# Patient Record
Sex: Female | Born: 1945 | Race: Black or African American | Hispanic: No | Smoking: Never smoker
Health system: Southern US, Community
[De-identification: ages and names within clinical notes are randomized; demographics above are authoritative.]

## PROBLEM LIST (undated history)

## (undated) DIAGNOSIS — H9319 Tinnitus, unspecified ear: Secondary | ICD-10-CM

## (undated) DIAGNOSIS — J45909 Unspecified asthma, uncomplicated: Secondary | ICD-10-CM

## (undated) DIAGNOSIS — F410 Panic disorder [episodic paroxysmal anxiety] without agoraphobia: Secondary | ICD-10-CM

## (undated) DIAGNOSIS — D649 Anemia, unspecified: Secondary | ICD-10-CM

## (undated) DIAGNOSIS — F419 Anxiety disorder, unspecified: Secondary | ICD-10-CM

---

## 2003-07-11 ENCOUNTER — Emergency Department (HOSPITAL_COMMUNITY): Admission: EM | Admit: 2003-07-11 | Discharge: 2003-07-11 | Payer: Self-pay | Admitting: Emergency Medicine

## 2003-07-16 ENCOUNTER — Emergency Department (HOSPITAL_COMMUNITY): Admission: EM | Admit: 2003-07-16 | Discharge: 2003-07-16 | Payer: Self-pay

## 2005-06-06 ENCOUNTER — Emergency Department (HOSPITAL_COMMUNITY): Admission: EM | Admit: 2005-06-06 | Discharge: 2005-06-06 | Payer: Self-pay | Admitting: Family Medicine

## 2007-12-27 ENCOUNTER — Emergency Department (HOSPITAL_COMMUNITY): Admission: EM | Admit: 2007-12-27 | Discharge: 2007-12-27 | Payer: Self-pay | Admitting: Orthopedic Surgery

## 2009-01-01 ENCOUNTER — Emergency Department (HOSPITAL_COMMUNITY): Admission: EM | Admit: 2009-01-01 | Discharge: 2009-01-01 | Payer: Self-pay | Admitting: Family Medicine

## 2009-04-05 ENCOUNTER — Emergency Department (HOSPITAL_COMMUNITY): Admission: EM | Admit: 2009-04-05 | Discharge: 2009-04-05 | Payer: Self-pay | Admitting: Emergency Medicine

## 2009-12-30 ENCOUNTER — Emergency Department (HOSPITAL_COMMUNITY): Admission: EM | Admit: 2009-12-30 | Discharge: 2009-12-30 | Payer: Self-pay | Admitting: Family Medicine

## 2010-01-01 ENCOUNTER — Emergency Department (HOSPITAL_COMMUNITY): Admission: EM | Admit: 2010-01-01 | Discharge: 2010-01-01 | Payer: Self-pay | Admitting: Emergency Medicine

## 2010-02-18 ENCOUNTER — Emergency Department (HOSPITAL_COMMUNITY): Admission: EM | Admit: 2010-02-18 | Discharge: 2010-02-18 | Payer: Self-pay | Admitting: Family Medicine

## 2010-06-13 ENCOUNTER — Emergency Department (HOSPITAL_COMMUNITY): Admission: EM | Admit: 2010-06-13 | Discharge: 2010-06-13 | Payer: Self-pay | Admitting: Family Medicine

## 2010-06-26 ENCOUNTER — Emergency Department (HOSPITAL_COMMUNITY)
Admission: EM | Admit: 2010-06-26 | Discharge: 2010-06-26 | Payer: Self-pay | Source: Home / Self Care | Admitting: Emergency Medicine

## 2010-10-19 ENCOUNTER — Emergency Department (HOSPITAL_COMMUNITY)
Admission: EM | Admit: 2010-10-19 | Discharge: 2010-10-19 | Payer: Self-pay | Source: Home / Self Care | Admitting: Emergency Medicine

## 2010-11-19 ENCOUNTER — Emergency Department (HOSPITAL_COMMUNITY)
Admission: EM | Admit: 2010-11-19 | Discharge: 2010-11-19 | Payer: Self-pay | Source: Home / Self Care | Admitting: Emergency Medicine

## 2011-01-04 LAB — POCT I-STAT, CHEM 8
BUN: 9 mg/dL (ref 6–23)
Calcium, Ion: 1.19 mmol/L (ref 1.12–1.32)
Chloride: 104 mEq/L (ref 96–112)
Sodium: 140 mEq/L (ref 135–145)
TCO2: 29 mmol/L (ref 0–100)

## 2011-01-14 LAB — POCT I-STAT, CHEM 8
BUN: 12 mg/dL (ref 6–23)
Chloride: 105 mEq/L (ref 96–112)
Glucose, Bld: 110 mg/dL — ABNORMAL HIGH (ref 70–99)
Potassium: 3.8 mEq/L (ref 3.5–5.1)
Sodium: 143 mEq/L (ref 135–145)

## 2011-02-01 LAB — POCT URINALYSIS DIP (DEVICE)
Glucose, UA: NEGATIVE mg/dL
Nitrite: NEGATIVE
Specific Gravity, Urine: 1.025 (ref 1.005–1.030)
Urobilinogen, UA: 0.2 mg/dL (ref 0.0–1.0)
pH: 6 (ref 5.0–8.0)

## 2011-07-16 LAB — POCT CARDIAC MARKERS
CKMB, poc: <1 — ABNORMAL LOW
Myoglobin, poc: 70.8
Operator id: 151321

## 2011-07-16 LAB — I-STAT 8, (EC8 V) (CONVERTED LAB)
Chloride: 108
HCT: 43
Sodium: 139
TCO2: 27
pCO2, Ven: 42.2 — ABNORMAL LOW

## 2011-07-16 LAB — POCT I-STAT CREATININE: Operator id: 151321

## 2012-05-07 ENCOUNTER — Inpatient Hospital Stay (HOSPITAL_COMMUNITY)
Admission: AD | Admit: 2012-05-07 | Discharge: 2012-05-07 | Disposition: A | Discharge status: Home / Self Care | Payer: Medicare Other | Source: Ambulatory Visit | Attending: Family Medicine | Admitting: Family Medicine

## 2012-05-07 ENCOUNTER — Encounter (HOSPITAL_COMMUNITY): Payer: Self-pay

## 2012-05-07 DIAGNOSIS — N8110 Cystocele, unspecified: Secondary | ICD-10-CM

## 2012-05-07 DIAGNOSIS — N8111 Cystocele, midline: Secondary | ICD-10-CM

## 2012-05-07 DIAGNOSIS — N898 Other specified noninflammatory disorders of vagina: Secondary | ICD-10-CM | POA: Insufficient documentation

## 2012-05-07 HISTORY — DX: Anemia, unspecified: D64.9

## 2012-05-07 NOTE — MAU Provider Note (Signed)
  History     CSN: [id number]  Arrival date and time: 05/07/12 1413   First Provider Initiated Contact with Patient 05/07/12 1508      Chief Complaint  Patient presents with  . Vaginal Bleeding   HPI [name] [name] [name] 66 y.o.  Comes to MAU today as she was squatting in the tub when bathing.  Felt a mass at the vaginal opening.  Pushed the mass back inside.  Hx of maternal uterine prolapse and is thinking this is happening to her.  OB History    Grav Para Term Preterm Abortions TAB SAB Ect Mult Living   3 3 3       3       Past Medical History  Diagnosis Date  . Anemia     History reviewed. No pertinent past surgical history.  History reviewed. No pertinent family history.  History  Substance Use Topics  . Smoking status: Never Smoker   . Smokeless tobacco: Not on file  . Alcohol Use: No    Allergies: No Known Allergies  Prescriptions prior to admission  Medication Sig Dispense Refill  . Multiple Vitamin (MULTIVITAMIN) capsule Take 1 capsule by mouth daily. Low rion      . Omega-3 Fatty Acids (OMEGA 3 PO) Take by mouth.        ROS Physical Exam   Blood pressure 123/70, pulse 97, temperature 97.6 F (36.4 C), temperature source Oral, resp. rate 18, height 5\' 7"  (1.702 m), weight 170 lb 6 oz (77.282 kg).  Physical Exam  Nursing note and vitals reviewed. Constitutional: She is oriented to person, place, and time. She appears well-developed and well-nourished.  HENT:  Head: Normocephalic.  Eyes: EOM are normal.  Neck: Neck supple.  GI: Soft. There is no tenderness.  Genitourinary:       Supine evaluation: Vulva - negative Finger exam of vagina - able to feel cervix - does not move with cough or valsalva. Minimal blood noted on glove. Soft cystocele forms at with valsalva to introitus but not beyond labia majora approx 3 cm in diameter Standing exam - no abnormality found with standing alone.  With cough, similar results to supine position.  Musculoskeletal:  Normal range of motion.  Neurological: She is alert and oriented to person, place, and time.  Skin: Skin is warm and dry.  Psychiatric: She has a normal mood and affect.    MAU Course  Procedures  MDM Consult with Dr. Shawnie Pons re: plan of care  Assessment and Plan  Bladder prolapse when squatting  Plan GYN clinic appointment Thursday, August 8 at 2:30 pm. Do not lift or do strenuous exercise requiring breath holding.  Do not squat. If prolapse occurs, push tissue inside.  Return sooner if unable to reduce any prolapse.   [name] [name],[name] 05/07/2012, 3:20 PM

## 2012-05-08 NOTE — MAU Provider Note (Signed)
Chart reviewed and agree with management and plan.  

## 2012-05-29 ENCOUNTER — Encounter: Payer: Self-pay | Admitting: Obstetrics & Gynecology

## 2012-05-29 ENCOUNTER — Ambulatory Visit (INDEPENDENT_AMBULATORY_CARE_PROVIDER_SITE_OTHER): Payer: Medicare Other | Admitting: Obstetrics & Gynecology

## 2012-05-29 VITALS — BP 121/70 | HR 80 | Temp 97.1°F | Ht 65.0 in | Wt 171.0 lb

## 2012-05-29 DIAGNOSIS — IMO0002 Reserved for concepts with insufficient information to code with codable children: Secondary | ICD-10-CM

## 2012-05-29 DIAGNOSIS — N8111 Cystocele, midline: Secondary | ICD-10-CM

## 2012-05-29 NOTE — Patient Instructions (Addendum)
Cystocele Repair A cystocele is a bulging, drooping hernia or break (rupture) of bladder tissue into the birth canal (vagina). This bulging or rupture occurs on the top front wall of the vagina. CAUSES  Cystocele is associated with weakness of the top front wall of the vagina due to stretching and tearing of the ligaments and muscles in the area. This is often the result of:  Multiple childbirths.   Continuous heavy lifting.   Chronic cough from asthma, emphysema, or smoking.   Being overweight.   Changes from aging.   Previous surgery in the vaginal area.   Menopause with loss of estrogen hormone and weakening of the ligaments and muscles around the bladder.  SYMPTOMS   Uncontrolled loss of urine (incontinence) with cough, sneeze, or exercise.   Pelvic pressure.   Frequency or urgency to urinate because of inability to completely empty the bladder.   Bladder infections.   Needing to push on the upper vagina to help yourself pass urine.  DIAGNOSIS  A cystocele can be diagnosed by doing a pelvic exam and observing the top of the vagina drooping or bulging into or out of the vagina. TREATMENT  Surgical options:  Cystocele repair is surgery that removes the hernia.   There are also different "sling" operations that may be used.  Discuss the different types of surgeries to repair a cystocele with your caregiver. Your caregiver will decide what type of surgery will be best in your case. Nonsurgical options:  Kegel exercises. This helps strengthen and tighten the muscles and tissue in and around the bladder and vagina. This may help with mild cases of cystocele.   A pessary may help the cystocele. A pessary is a plastic or rubber device that lifts the bladder into place. A pessary must be fitted by a doctor.   Tampons or diaphragms that lift the bladder into place are sometimes helpful with a minor or small cystocele.   Estrogen may help with mild cases in menopausal and aging  women.  LET YOUR CAREGIVER KNOW ABOUT:   Allergies to food or medicine.   Medicines taken, including vitamins, herbs, eyedrops, over-the-counter medicines, and creams.   Use of steroids (by mouth or creams).   Previous problems with anesthetics or numbing medicines.   History of bleeding problems or blood clots.   Previous surgery.   Other health problems, including diabetes and kidney problems.   Possibility of pregnancy, if this applies.  RISKS AND COMPLICATIONS  All surgery is associated with risks.  There are risks with a general anesthesia. You should discuss this with your caregiver.   With spinal or epidural anesthesia, there may be an area that is not numbed, and you could feel pain.   Headache could occur with a spinal or epidural anesthetic.   The catheter you will have after surgery may not work properly or may get blocked and need to be replaced.   Excessive bleeding.   Infection.   Injury to surrounding structures.   Recurrence of the cystocele.   Surgery may not get rid of your symptoms.  BEFORE THE PROCEDURE   Do not take aspirin or blood thinners for 1 week prior to surgery, unless instructed otherwise.   Do not eat or drink anything after midnight the night before surgery.   Let your caregiver know if you develop a cold or other infectious problems prior to surgery.   If being admitted the day of surgery, you should be present 1 hour prior to your   procedure or as directed by your caregiver.   Plan and arrange for help when you go home from the hospital.   If you smoke, do not smoke for at least 2 weeks before the surgery.   Do not drink any alcohol for 3 days before the surgery.  PROCEDURE  You will be given an anesthetic to prevent you from feeling pain during surgery. This may be a general anesthetic that puts you to sleep, or a spinal or epidural anesthetic. You will be asleep or be numbed through the entire procedure. During cystocele repair,  tissue is pulled from the sides and around the top of the vagina to lift up the hernia. This removes the hernia so that the top of the vagina does not fall into the opening of the vagina. AFTER THE PROCEDURE  After surgery, you will be taken to the recovery room where a nurse will take care of you, checking your breathing, blood pressure, pulse, and your progress. When your caregiver feels you are stable, you will be taken to your room. You will have a drainage tube (Foley catheter) that will drain your bladder for 2 to 7 days or longer, until your bladder is working properly. This catheter is placed prior to surgery to help keep your bladder empty and out of the way during the procedure. After surgery, this will make passing your urine easier. The catheter will be removed when you can easily pass urine without this assistance. You may have gauze packing in the vagina that will be removed 1 to 2 days after the surgery. Usually, you will be given a medicine (antibiotic) that kills germs. You will be given pain medicine as needed. You can usually go home in 3 to 5 days. HOME CARE INSTRUCTIONS   Do not take baths. Take showers until your caregiver informs you otherwise.   Take antibiotics as directed by your caregiver.   Exercise as instructed. Do not perform exercises which increase the pressure inside your belly (abdomen), such as sit-ups or lifting weights, until your caregiver has given permission. Walking exercise is preferred.   Only take over-the-counter or prescription medicines for pain and discomfort as directed by your caregiver.   Do not drink alcohol while taking pain medicine.   Do not lift anything over 5 pounds.   Do not drive until your caregiver gives you permission.   Get plenty of rest and sleep.   Have someone help with your household chores for 1 to 2 weeks.   If you develop constipation, you may take a mild laxative with your caregiver's permission. Eating bran foods and  drinking enough water and fluids to keep your urine clear or pale yellow helps with constipation.   Do not take aspirin. It may cause bleeding.   You may resume normal diet and unstrenuous activities as directed.   Do not douche, use tampons, or engage in intercourse until your surgeon has given permission.   Change bandages (dressings) as directed.   Make and keep all your postoperative appointments.  SEEK MEDICAL CARE IF:   You have abnormal vaginal discharge.   You develop a rash.   You are having a reaction to your medicine.   You develop nausea or vomiting.  SEEK IMMEDIATE MEDICAL CARE IF:   You have redness, swelling, or increasing pain in the vaginal area.   You notice pus coming from the vagina.   You have a fever.   You notice a bad smell coming from the vagina.     You have increasing abdominal pain.   You have frequent urination or you notice burning during urination.   You notice blood in your urine.   You have excessive vaginal bleeding.   You cannot urinate.  MAKE SURE YOU:   Understand these instructions.   Will watch your condition.   Will get help right away if you are not doing well or get worse.  Document Released: 10/05/2000 Document Revised: 09/27/2011 Document Reviewed: 01/05/2010 [employer] Patient Information 2012 [employer].Prolapse  Prolapse means the falling down, bulging, dropping, or drooping of a body part. Organs that commonly prolapse include the rectum, small intestine, bladder, urethra, vagina (birth canal), uterus (womb), and cervix. Prolapse occurs when the ligaments and muscle tissue around the rectum, bladder, and uterus are damaged or weakened.  CAUSES  This happens especially with:  Childbirth. Some women feel pelvic pressure or have trouble holding their urine right after childbirth, because of stretching and tearing of pelvic tissues. This generally gets better with time and the feeling usually goes away, but it may return  with aging.   Chronic heavy lifting.   Aging.   Menopause, with loss of estrogen production weakening the pelvic ligaments and muscles.   Past pelvic surgery.   Obesity.   Chronic constipation.   Chronic cough.  Prolapse may affect a single organ, or several organs may prolapse at the same time. The front wall of the vagina holds up the bladder. The back wall holds up part of the lower intestine, or rectum. The uterus fills a spot in the middle. All these organs can be involved when the ligaments and muscles around the vagina relax too much. This often gets worse when women stop producing estrogen (menopause). SYMPTOMS  Uncontrolled loss of urine (incontinence) with cough, sneeze, straining, and exercise.   More force may be required to have a bowel movement, due to trapping of the stool.   When part of an organ bulges through the opening of the vagina, there is sometimes a feeling of heaviness or pressure. It may feel as though something is falling out. This sensation increases with coughing or bearing down.   If the organs protrude through the opening of the vagina and rub against the clothing, there may be soreness, ulcers, infection, pain, and bleeding.   Lower back pain.   Pushing in the upper or lower part of the vagina, to pass urine or have a bowel movement.   Problems having sexual intercourse.   Being unable to insert a tampon or applicator.  DIAGNOSIS  Usually, a physical exam is all that is needed to identify the problem. During the examination, you may be asked to cough and strain while lying down, sitting up, and standing up. Your caregiver will determine if more testing is required, such as bladder function tests. Some diagnoses are:  Cystocele: Bulging and falling of the bladder into the top of the vagina.   Rectocele: Part of the rectum bulging into the vagina.   Prolapse of the uterus: The uterus falls or drops into the vagina.   Enterocele: Bulging of the  top of the vagina, after a hysterectomy (uterus removal), with the small intestine bulging into the vagina. A hernia in the top of the vagina.   Urethrocele: The urethra (urine carrying tube) bulging into the vagina.  TREATMENT  In most cases, prolapse needs to be treated only if it produces symptoms. If the symptoms are interfering with your usual daily or sexual activities, treatment may be necessary.   The following are some measures that may be used to treat prolapse.  Estrogen may help elderly women with mild prolapse.   Kegel exercises may help mild cases of prolapse, by strengthening and tightening the muscles of the pelvic floor.   Pessaries are used in women who choose not to, or are unable to, have surgery. A pessary is a doughnut-shaped piece of plastic or rubber that is put into the vagina to keep the organs in place. This device must be fitted by your caregiver. Your caregiver will also explain how to care for yourself with the pessary. If it works well for you, this may be the only treatment required.   Surgery is often the only form of treatment for more severe prolapses. There are different types of surgery available. You should discuss what the best procedure is for you. If the uterus is prolapsed, it may be removed (hysterectomy) as part of the surgical treatment. Your caregiver will discuss the risks and benefits with you.   Uterine-vaginal suspension (surgery to hold up the organs) may be used, especially if you want to maintain your fertility.  No form of treatment is guaranteed to correct the prolapse or relieve the symptoms. HOME CARE INSTRUCTIONS   Wear a sanitary pad or absorbent product if you have incontinence of urine.   Avoid heavy lifting and straining with exercise and work.   Take over-the-counter pain medicine for minor discomfort.   Try taking estrogen or using estrogen vaginal cream.   Try Kegel exercises or use a pessary, before deciding to have surgery.     Do Kegel exercises after having a baby.  SEEK MEDICAL CARE IF:   Your symptoms interfere with your daily activities.   You need medicine to help with the discomfort.   You need to be fitted with a pessary.   You notice bleeding from the vagina.   You think you have ulcers or you notice ulcers on the cervix.   You have an oral temperature above 102 F (38.9 C).   You develop pain or blood with urination.   You have bleeding with a bowel movement.   The symptoms are interfering with your sex life.   You have urinary incontinence that interferes with your daily activities.   You lose urine with sexual intercourse.   You have a chronic cough.   You have chronic constipation.  Document Released: 04/14/2003 Document Revised: 09/27/2011 Document Reviewed: 10/23/2009 [employer] Patient Information 2012 [employer]. 

## 2012-05-29 NOTE — Progress Notes (Signed)
Pt is very focused on being natural

## 2012-05-29 NOTE — Progress Notes (Signed)
Subjective:     Patient ID: [name] [name], female   DOB: 1945-12-21, 66 y.o.   MRN: [id number]  HPI  Pt is a P3 who is s/p SVD x 3 who was seen in the MAU 3 weeks previously 2nd to bulge in vagina.  Pt reports that she subsequently discovered that she was constipated and thinks that that contributed to the problem.  Pt denies vaginal bleeding or leakage of urine.  Has not had a GYN exam for 'awhile'.  Pt denies being sexually active.     Review of Systems N/C     Objective:   Physical Exam   VSS:  Afebrile  Abd: soft, NT, ND GU: EGBUS: no lesions Vagina: no blood in vault.  Grade II cystocele Cervix: no lesion; no mucopurulent d/c Uterus: small, mobile.  No prolapse noted even with valsalva Adnexa: no masses; sl tender      Assessment:     Grade II cystocele.  Reviewed with pt options for treatment including surgery and pessary and conservation nontreatment.  Pt does not want medical or surgical treatment     Plan:     F/u 3months PAP at next visit Reviewed with pt phytoestrogens which she will consider Kegel exercises  Jolleen Seman L. Harraway-Smith, M.D., [place]

## 2012-07-08 ENCOUNTER — Telehealth: Payer: Self-pay | Admitting: *Deleted

## 2012-07-08 NOTE — Telephone Encounter (Signed)
LM for patient to return call to clinic.

## 2012-07-08 NOTE — Telephone Encounter (Signed)
Pt left message stating that she was seen in clinic 1 month ago for prolapsed bladder. She is taking estrogen. She reports that she is still having pressure and ringing in her ears. She wants to know what to do.

## 2012-07-09 NOTE — Telephone Encounter (Signed)
Called pt and I advised pt that she should make a follow up appt with a provider for further evaluation.  Pt stated understanding and had no further questions.  Transferred [name] [name] to the front desk to schedule an appt.

## 2012-07-16 ENCOUNTER — Encounter (HOSPITAL_COMMUNITY): Payer: Self-pay

## 2012-07-16 ENCOUNTER — Emergency Department (HOSPITAL_COMMUNITY)
Admission: EM | Admit: 2012-07-16 | Discharge: 2012-07-16 | Disposition: A | Discharge status: Home / Self Care | Payer: Medicare Other | Attending: Emergency Medicine | Admitting: Emergency Medicine

## 2012-07-16 DIAGNOSIS — F410 Panic disorder [episodic paroxysmal anxiety] without agoraphobia: Secondary | ICD-10-CM | POA: Insufficient documentation

## 2012-07-16 DIAGNOSIS — F411 Generalized anxiety disorder: Secondary | ICD-10-CM | POA: Insufficient documentation

## 2012-07-16 HISTORY — DX: Anxiety disorder, unspecified: F41.9

## 2012-07-16 NOTE — ED Notes (Signed)
Pt states ringing in her ears caused her to have a anxiety attack, states feels better now

## 2012-07-16 NOTE — ED Provider Notes (Signed)
History     CSN: [id number]  Arrival date & time 07/16/12  2132   First MD Initiated Contact with Patient 07/16/12 2207      Chief Complaint  Patient presents with  . Anxiety    (Consider location/radiation/quality/duration/timing/severity/associated sxs/prior treatment) HPI Comments: Patient presents after having an anxiety attack that was triggered by patient experiencing ringing in her ears intermittently for the past several weeks. The tinnitus symptoms are very concerning to her. Patient describes the sensation of palpitations and chest pressure with the anxiety. Patient went to a nearby fire department and an ambulance was called to transport the patient to hospital. Patient states all of her symptoms are resolved upon arrival to ED. Her chest pain was described as a pressure, was nonradiating, was over the middle of her chest. She states that she has had this with previous anxiety attacks. Patient has no shortness of breath. She denies nausea or vomiting. The pain resolved after approximately 45 minutes. Patient denies history of cardiac problems, high blood pressure, high cholesterol, smoking, family history of coronary artery disease. Onset was acute. Course is resolved. Nothing makes symptoms better or worse.  The history is provided by the patient.    Past Medical History  Diagnosis Date  . Anemia   . Anxiety     History reviewed. No pertinent past surgical history.  No family history on file.  History  Substance Use Topics  . Smoking status: Never Smoker   . Smokeless tobacco: Not on file  . Alcohol Use: No    OB History    Grav Para Term Preterm Abortions TAB SAB Ect Mult Living   3 3 3       3       Review of Systems  Constitutional: Negative for fever.  HENT: Positive for tinnitus. Negative for hearing loss, ear pain, sore throat and rhinorrhea.   Eyes: Negative for redness.  Respiratory: Negative for cough and shortness of breath.   Cardiovascular:  Positive for chest pain and palpitations. Negative for leg swelling.  Gastrointestinal: Negative for nausea, vomiting, abdominal pain and diarrhea.  Genitourinary: Negative for dysuria.  Musculoskeletal: Negative for myalgias.  Skin: Negative for rash.  Neurological: Negative for headaches.  Psychiatric/Behavioral: The patient is nervous/anxious.     Allergies  Review of patient's allergies indicates no known allergies.  Home Medications   Current Outpatient Rx  Name Route Sig Dispense Refill  . MULTIVITAMINS PO CAPS Oral Take 1 capsule by mouth daily. Low rion    . OMEGA 3 PO Oral Take 1 tablet by mouth daily.     Marland Kitchen PRESCRIPTION MEDICATION Oral Take 1 capsule by mouth 4 (four) times daily.    Marland Kitchen VITAMIN B-12 100 MCG PO TABS Oral Take 50 mcg by mouth daily.      BP 136/75  Pulse 98  Temp 98.6 F (37 C) (Oral)  Resp 20  SpO2 100%  Physical Exam  Nursing note and vitals reviewed. Constitutional: She appears well-developed and well-nourished.  HENT:  Head: Normocephalic and atraumatic.  Eyes: Conjunctivae normal are normal. Right eye exhibits no discharge. Left eye exhibits no discharge.  Neck: Normal range of motion. Neck supple.  Cardiovascular: Normal rate, regular rhythm and normal heart sounds.   Pulmonary/Chest: Effort normal and breath sounds normal.  Abdominal: Soft. There is no tenderness.  Neurological: She is alert.  Skin: Skin is warm and dry.  Psychiatric: She has a normal mood and affect.    ED Course  Procedures (including  critical care time)  Labs Reviewed - No data to display No results found.   1. Panic attack     11:37 PM Patient seen and examined. EKG ordered. Patient's anxiety symptoms have resolved.   Vital signs reviewed and are as follows: Filed Vitals:   07/16/12 2142  BP: 136/75  Pulse: 98  Temp: 98.6 F (37 C)  Resp: 20    Date: 07/17/2012  Rate: 73  Rhythm: normal sinus rhythm  QRS Axis: normal  Intervals: normal  ST/T Wave  abnormalities: nonspecific ST changes  Conduction Disutrbances:none  Narrative Interpretation: PR depression  Old EKG Reviewed: unchanged from 06/26/2010  Patient was discussed and seen by Dr. Adriana Simas. The patient's chest pain symptoms corresponded exactly with her anxiety symptoms. EKG performed is abnormal but unchanged from EKG performed 2 years ago.  Patient urged to followup with her primary care physician for further evaluation of her symptoms including her tinnitus.   MDM  Chest pain: Patient is low risk, however she is 66 years old. Patient's symptoms corresponded exactly with her anxiety symptoms. She has had this exact type of chest pain in the past with her anxiety. EKG is abnormal however it is unchanged. Patient has another clinical reason for her chest pain. Do not feel this represents GCS at this time. Given unchanged EKG, do not feel that further workup is necessary at this time. Patient given strict return instructions and will followup with her primary care physician.  Anxiety attack resolved. No SI/HI.         Renne Crigler, [place] 07/17/12 0140

## 2012-07-16 NOTE — ED Notes (Signed)
Per EMS, pt c/o panicking at home.  When EMS, elevated hr and b/p.  Pt has hx panic attacks.  Pt recently started having ringing in ear a week ago.  Causing stress.  Pt has seen MD for sx.  Pt not able to answer many questions to EMS. Vitals  180/86, pulse 100, resp 18, 99% on ra

## 2012-07-18 ENCOUNTER — Emergency Department (HOSPITAL_COMMUNITY): Payer: Medicare Other

## 2012-07-18 ENCOUNTER — Encounter (HOSPITAL_COMMUNITY): Payer: Self-pay | Admitting: *Deleted

## 2012-07-18 ENCOUNTER — Emergency Department (HOSPITAL_COMMUNITY)
Admission: EM | Admit: 2012-07-18 | Discharge: 2012-07-18 | Disposition: A | Discharge status: Home / Self Care | Payer: Medicare Other | Attending: Emergency Medicine | Admitting: Emergency Medicine

## 2012-07-18 DIAGNOSIS — R55 Syncope and collapse: Secondary | ICD-10-CM | POA: Insufficient documentation

## 2012-07-18 DIAGNOSIS — R110 Nausea: Secondary | ICD-10-CM | POA: Insufficient documentation

## 2012-07-18 HISTORY — DX: Panic disorder (episodic paroxysmal anxiety): F41.0

## 2012-07-18 HISTORY — DX: Tinnitus, unspecified ear: H93.19

## 2012-07-18 LAB — URINALYSIS, ROUTINE W REFLEX MICROSCOPIC
Ketones, ur: 15 mg/dL — AB
Specific Gravity, Urine: 1.009 (ref 1.005–1.030)

## 2012-07-18 LAB — POCT I-STAT, CHEM 8
BUN: 14 mg/dL (ref 6–23)
Calcium, Ion: 1.26 mmol/L (ref 1.13–1.30)
Chloride: 103 mEq/L (ref 96–112)
Creatinine, Ser: 1 mg/dL (ref 0.50–1.10)
Glucose, Bld: 90 mg/dL (ref 70–99)
HCT: 38 % (ref 36.0–46.0)
Hemoglobin: 12.9 g/dL (ref 12.0–15.0)
Potassium: 4 meq/L (ref 3.5–5.1)
Sodium: 139 mEq/L (ref 135–145)
TCO2: 28 mmol/L (ref 0–100)

## 2012-07-18 LAB — CBC
HCT: 36.7 % (ref 36.0–46.0)
Hemoglobin: 12.1 g/dL (ref 12.0–15.0)
MCH: 28.1 pg (ref 26.0–34.0)
MCHC: 33 g/dL (ref 30.0–36.0)
MCV: 85.3 fL (ref 78.0–100.0)
Platelets: 285 10*3/uL (ref 150–400)
RBC: 4.3 MIL/uL (ref 3.87–5.11)
RDW: 14.5 % (ref 11.5–15.5)
WBC: 5.8 K/uL (ref 4.0–10.5)

## 2012-07-18 LAB — URINE MICROSCOPIC-ADD ON

## 2012-07-18 NOTE — ED Notes (Signed)
Feel like going to pass out. Started this am. When home bp 180/90, now sbp 120-130/90.  At [facility] on 07/16/12 for anxiety problem.

## 2012-07-18 NOTE — ED Provider Notes (Signed)
Medical screening examination/treatment/procedure(s) were conducted as a shared visit with non-physician practitioner(s) and myself.  I personally evaluated the patient during the encounter.  History and physical most consistent with anxiety today. Patient has absolutely no chest pain at this time.  Donnetta Hutching, MD 07/18/12 [phone]

## 2012-07-18 NOTE — ED Notes (Signed)
Chem 8 results=  Na=139 K=4.0 Cl=103 Ica1.26 Tco2=28 Glu=90 bun14 Crea=1.0 Hct=38 Hb*=12.9 Angap=12

## 2012-07-18 NOTE — ED Notes (Signed)
Pt states "Im back here because I felt like I was going to pass out at home and I just wanted to be able to take my own car but I started getting sicker and sicker." "I'm trying to figure out why when I turn on my television at home I get deathly ill if I watch it for 30 minutes." Pt states she believes electronic frequency waves are bothering her.Pt states "if I'm listening to the radio this happens as well."

## 2012-07-18 NOTE — ED Provider Notes (Signed)
History     CSN: [id number]  Arrival date & time 07/18/12  1017   First MD Initiated Contact with Patient 07/18/12 1419      Chief Complaint  Patient presents with  . Near Syncope  . Blurred Vision  . Nausea     HPI   The patient presents with multiple complaints.  Concerning to the patient is the recent development of episodic near syncope, tinnitus, pulse incision in her years.  She states that the symptoms are provoked when she is watching TV or listening to the radio.  Her symptoms improved after unplugging a television, or when listening to the radio running on batteries.  She states that symptoms began gradually, progressi until she stops the offending activity, and today was a particularly bad episode.  She notes that after she was feeling near syncopal, she drove here for evaluation.  Symptoms resolved in the waiting room, as she was not watching TV, or listening to a radio. She has been evaluated for anxiety in the past month.  She states that since that evaluation she has had episodes of chest pain, but seemed to occur when her near syncopal symptoms are most pronounced. The patient denies significant medical problems. She states that she is a family history of neurologic disorders, with both of her parents dying from neurologic phenomena.  Past Medical History  Diagnosis Date  . Anemia   . Anxiety   . Panic attacks   . Ringing in ear     History reviewed. No pertinent past surgical history.  No family history on file.  History  Substance Use Topics  . Smoking status: Never Smoker   . Smokeless tobacco: Not on file  . Alcohol Use: No    OB History    Grav Para Term Preterm Abortions TAB SAB Ect Mult Living   3 3 3       3       Review of Systems  Constitutional:       HPI  HENT:       HPI otherwise negative  Eyes: Negative.   Respiratory:       HPI, otherwise negative  Cardiovascular:       HPI, otherwise nmegative  Gastrointestinal: Negative for  nausea, vomiting and abdominal pain.  Genitourinary:       HPI, otherwise negative  Musculoskeletal:       HPI, otherwise negative  Skin: Negative.   Neurological: Positive for light-headedness. Negative for dizziness, tremors, seizures, syncope, facial asymmetry, speech difficulty, numbness and headaches.  Psychiatric/Behavioral: Negative for suicidal ideas, hallucinations, behavioral problems, confusion, disturbed wake/sleep cycle, self-injury and agitation. The patient is nervous/anxious. The patient is not hyperactive.     Allergies  Review of patient's allergies indicates no known allergies.  Home Medications   Current Outpatient Rx  Name Route Sig Dispense Refill  . MULTIVITAMINS PO CAPS Oral Take 1 capsule by mouth daily. Low rion    . OMEGA 3 PO Oral Take 1 tablet by mouth daily.     Marland Kitchen PRESCRIPTION MEDICATION Oral Take 1 capsule by mouth 4 (four) times daily.      BP 133/67  Pulse 101  Temp 98.3 F (36.8 C) (Oral)  Resp 18  SpO2 99%  Physical Exam  Nursing note and vitals reviewed. Constitutional: She is oriented to person, place, and time. She appears well-developed and well-nourished. No distress.  HENT:  Head: Normocephalic and atraumatic.  Eyes: Conjunctivae normal and EOM are normal.  Cardiovascular: Normal rate  and regular rhythm.   Pulmonary/Chest: Effort normal and breath sounds normal. No stridor. No respiratory distress.  Abdominal: She exhibits no distension.  Musculoskeletal: She exhibits no edema.  Neurological: She is alert and oriented to person, place, and time. No cranial nerve deficit.  Skin: Skin is warm and dry.  Psychiatric: She has a normal mood and affect. Her speech is normal and behavior is normal. Judgment normal. Thought content is delusional. Thought content is not paranoid. Cognition and memory are impaired. She exhibits normal recent memory and normal remote memory.    ED Course  Procedures (including critical care time)   Labs  Reviewed  CBC  POCT I-STAT, CHEM 8  URINALYSIS, ROUTINE W REFLEX MICROSCOPIC   No results found.   No diagnosis found.   Date: 07/18/2012  Rate: 70  Rhythm: normal sinus rhythm  QRS Axis: normal  Intervals: normal  ST/T Wave abnormalities: normal  Conduction Disutrbances:none  Narrative Interpretation:   Old EKG Reviewed: unchanged LVH, otherwise unremarkable / unchanged   MDM  This pleasant elderly female presents with concerns of recurrent near syncopal events associated with tinnitus, ear fullness.  On my exam the patient is in no distress.  She also denies any ongoing symptoms.  Given the patient's history of similar prior events, or history of anxiety, her description of very unusual precipitant, there is low suspicion for acute ongoing pathology.  I discussed the need for outpatient evaluation including a discussion on the patient's believe that the symptoms are provoked by external stimuli.  The patient denies any psychiatric history, any auditory or visual hallucinations, and though this seems to be evidence, she does have capacity, otherwise has reasonable insight into her condition, and is appropriate for ongoing management as an outpatient.    Gerhard Munch, MD 07/18/12 1705

## 2012-07-18 NOTE — ED Notes (Signed)
Patient stated going outside to make a phone call. Will be right back.

## 2012-07-18 NOTE — ED Notes (Signed)
Patient transported to CT 

## 2012-07-20 ENCOUNTER — Emergency Department (INDEPENDENT_AMBULATORY_CARE_PROVIDER_SITE_OTHER)
Admission: EM | Admit: 2012-07-20 | Discharge: 2012-07-20 | Disposition: A | Discharge status: Nursing Home (Includes ICF) | Payer: Medicare Other | Source: Home / Self Care

## 2012-07-20 ENCOUNTER — Encounter (HOSPITAL_COMMUNITY): Payer: Self-pay | Admitting: Emergency Medicine

## 2012-07-20 ENCOUNTER — Encounter (HOSPITAL_COMMUNITY): Payer: Self-pay | Admitting: *Deleted

## 2012-07-20 ENCOUNTER — Emergency Department (HOSPITAL_COMMUNITY)
Admission: EM | Admit: 2012-07-20 | Discharge: 2012-07-20 | Disposition: A | Discharge status: Home / Self Care | Payer: Medicare Other | Attending: Emergency Medicine | Admitting: Emergency Medicine

## 2012-07-20 DIAGNOSIS — F22 Delusional disorders: Secondary | ICD-10-CM | POA: Insufficient documentation

## 2012-07-20 DIAGNOSIS — R440 Auditory hallucinations: Secondary | ICD-10-CM

## 2012-07-20 DIAGNOSIS — R443 Hallucinations, unspecified: Secondary | ICD-10-CM

## 2012-07-20 DIAGNOSIS — F411 Generalized anxiety disorder: Secondary | ICD-10-CM | POA: Insufficient documentation

## 2012-07-20 MED ORDER — ONDANSETRON HCL 8 MG PO TABS: 4.0000 mg | ORAL_TABLET | Freq: Three times a day (TID) | ORAL | Status: DC | PRN

## 2012-07-20 MED ORDER — LORAZEPAM 1 MG PO TABS: 1.0000 mg | ORAL_TABLET | Freq: Three times a day (TID) | ORAL | Status: DC | PRN

## 2012-07-20 MED ORDER — IBUPROFEN 200 MG PO TABS: 600.0000 mg | ORAL_TABLET | Freq: Three times a day (TID) | ORAL | Status: DC | PRN

## 2012-07-20 MED ORDER — ALUM & MAG HYDROXIDE-SIMETH 200-200-20 MG/5ML PO SUSP: 30.0000 mL | ORAL | Status: DC | PRN

## 2012-07-20 MED ORDER — ACETAMINOPHEN 325 MG PO TABS: 650.0000 mg | ORAL_TABLET | ORAL | Status: DC | PRN

## 2012-07-20 MED ORDER — NICOTINE 21 MG/24HR TD PT24: 21.0000 mg | MEDICATED_PATCH | Freq: Every day | TRANSDERMAL | Status: DC

## 2012-07-20 MED ORDER — ZOLPIDEM TARTRATE 5 MG PO TABS: 10.0000 mg | ORAL_TABLET | Freq: Every evening | ORAL | Status: DC | PRN

## 2012-07-20 NOTE — ED Notes (Signed)
Provider requesting to speak with someone from [facility].  I spoke with Tawanna Cooler from [facility].  He stated patient could need to be transferred to ED for evaluation.  He stated he would page [facility] team member to call us and he took patient's name and MRN #

## 2012-07-20 NOTE — ED Notes (Addendum)
Pt. Stated, I've got somebody stalking me for 10 years.  I've called the FBI, SBI, and all the federal people.  They are taking over all my computer.   Pt. [facility] and was sent here for psych evaluation. Denies SI/HI at the time.

## 2012-07-20 NOTE — ED Provider Notes (Addendum)
History   This chart was scribed for [name] Givens, MD by Melba Coon. The patient was seen in room TR06C/TR06C and the patient's care was started at 3:40PM.    CSN: [id number]  Arrival date & time 07/20/12  1336   First MD Initiated Contact with Patient 07/20/12 1517      Chief Complaint  Patient presents with  . Hallucinations    (Consider location/radiation/quality/duration/timing/severity/associated sxs/prior treatment) The history is provided by the patient. No language interpreter was used.   [name] [name] is a 66 y.o. female who presents to the Emergency Department "against her own free will" escorted by the police department for hallucinations today. [name] [name] reports her phone is being tapped. This isn't her first time reporting her phone being tapped and has multiple reports with the SBI and FBI for many years. She states that when she went to the police dept to report it today, she "was tricked" to come to the ED today. She also reports tinnitus since 1.5 months ago that was worsened when she listened to the TV or the radio the past few weeks. She called the police at the first incident and was taken to [facility]. She was checked and everything was fine. She still reports having tinnitus but only when she listens to the TV or radio at her house. She "unplugged everything from the wall" in her bedroom, took any eectronic devices out of her bedroom, and has not had a problem with tinnitus since. She also hears beeps when she has phone calls, including today, saying that she is being recorded. She feels like she is being stalked in her own house. Neighbors have not had similar problems with suspicions of being stalked. She has never talked to a psychiatrist or been to a psychiatric hospital. She states she is physically fine and does not complain of any pain or symptoms. 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 She relates she's related to "everything who is famous" and specifically states she's related to all the presidents including Mr Phillips [place], [place], [place], and [place]. Patient reports she tried to go to a safe place today because she felt like someone was talking her in her apartment and she was hearing beeping in her bedroom. She states she went to the fire department and tried to report the wire tapping incident however they state she can report the wire tapping incident until she was certified to not be crazy. Patient also concerned she has a new Medicaid card and has a mental health name and number on it. Patient also states she's having problems on the Internet and she gets messages that state "you are not allowed access to the site".  .PCP: Dr. Everlene Other; has an appt for Oct 3rd  Past Medical History  Diagnosis Date  . Anemia   . Anxiety   . Panic attacks   . Ringing in ear     History reviewed. No pertinent past surgical history.  No family history on file.  History  Substance Use Topics  . Smoking status: Never Smoker   . Smokeless tobacco: Not on file  . Alcohol Use: No  No recent deaths in the family, not married. Used to be a Pharmacologist retired  OB History    Grav Para Term Preterm Abortions TAB SAB Ect Mult Living   3 3 3       3       Review of Systems 10 Systems reviewed and all are negative for acute change except as noted in the HPI.   Allergies  Review of patient's allergies indicates no known allergies.  Home Medications   Current Outpatient Rx  Name Route Sig Dispense Refill  . MULTIVITAMINS PO CAPS Oral Take 1 capsule by mouth daily. Low rion    . OMEGA 3 PO Oral Take 1 tablet by mouth daily.     Marland Kitchen PRESCRIPTION MEDICATION Oral Take 1 capsule by mouth 4 (four) times daily.      BP  132/76  Pulse 109  Temp 98.3 F (36.8 C) (Oral)  Resp 16  SpO2 98%  Vital signs normal    Physical Exam  Nursing note and vitals reviewed. Constitutional: She is oriented to person, place, and time. She appears well-developed and well-nourished. No distress.  HENT:  Head: Normocephalic and atraumatic.  Right Ear: External ear normal.  Left Ear: External ear normal.  Nose: Nose normal.  Mouth/Throat: Oropharynx is clear and moist.  Eyes: Conjunctivae normal and EOM are normal. Pupils are equal, round, and reactive to light.  Neck: Normal range of motion. Neck supple. No tracheal deviation present.  Cardiovascular: Normal rate, regular rhythm and normal heart sounds.   No murmur heard. Pulmonary/Chest: Effort normal and breath sounds normal. No respiratory distress. She has no wheezes.  Abdominal: Soft. She exhibits no distension. There is no tenderness.  Musculoskeletal: Normal range of motion. She exhibits no edema and no tenderness.  Neurological: She is alert and oriented to person, place, and time.  Skin: Skin is warm and dry.  Psychiatric:       Pt seems paranoid, gets agitated easily.    ED Course  Procedures (including critical care time)  DIAGNOSTIC STUDIES: Oxygen Saturation is 100% on room air, normal by my interpretation.    COORDINATION OF CARE:  3:50PM - [name] Dede refuses blood w/u because she had it done 2 days ago here at [facility]  [name] Greenhouse left her room and then returned. Accused me of "tricking" her to talk to me.  Getting agitated and wanting to leave, states I can't hold her "against her will".   Commitment papers were signed and filled out to keep patient in the ED to finish her psychiatric evaluation.  5:00PM - Police officers present report that she has longstanding hx of mental issues and is in denial of her mental issues. Commitment papers were filed for [name] [name].  20:03 Dr Jacky Kindle, telepsych discussed patient will do consult now.   2018  Dr Jacky Kindle relates patient has a delusional disorder, but she refuses to take seroquel, she only wants homeopathic medications. Feels her IVC can be rescended and she can be discharged.   Results for orders placed during the hospital encounter of 07/18/12  CBC      Component Value Range   WBC 5.8  4.0 - 10.5 K/uL   RBC 4.30  3.87 - 5.11 MIL/uL   Hemoglobin 12.1  12.0 - 15.0 g/dL   HCT 47.8  29.5 - 62.1 %   MCV 85.3  78.0 - 100.0 fL   MCH 28.1  26.0 - 34.0 pg   MCHC 33.0  30.0 - 36.0 g/dL   RDW 30.8  65.7 - 84.6 %   Platelets 285  150 -  400 K/uL  POCT I-STAT, CHEM 8      Component Value Range   Sodium 139  135 - 145 mEq/L   Potassium 4.0  3.5 - 5.1 mEq/L   Chloride 103  96 - 112 mEq/L   BUN 14  6 - 23 mg/dL   Creatinine, Ser 1.19  0.50 - 1.10 mg/dL   Glucose, Bld 90  70 - 99 mg/dL   Calcium, Ion 1.47  8.29 - 1.30 mmol/L   TCO2 28  0 - 100 mmol/L   Hemoglobin 12.9  12.0 - 15.0 g/dL   HCT 56.2  13.0 - 86.5 %  URINALYSIS, ROUTINE W REFLEX MICROSCOPIC      Component Value Range   Color, Urine YELLOW  YELLOW   APPearance CLEAR  CLEAR   Specific Gravity, Urine 1.009  1.005 - 1.030   pH 7.5  5.0 - 8.0   Glucose, UA NEGATIVE  NEGATIVE mg/dL   Hgb urine dipstick NEGATIVE  NEGATIVE   Bilirubin Urine NEGATIVE  NEGATIVE   Ketones, ur 15 (*) NEGATIVE mg/dL   Protein, ur NEGATIVE  NEGATIVE mg/dL   Urobilinogen, UA 1.0  0.0 - 1.0 mg/dL   Nitrite NEGATIVE  NEGATIVE   Leukocytes, UA SMALL (*) NEGATIVE  URINE MICROSCOPIC-ADD ON      Component Value Range   Squamous Epithelial / LPF MANY (*) RARE   WBC, UA 0-2  <3 WBC/hpf   RBC / HPF 0-2  <3 RBC/hpf   Bacteria, UA RARE  RARE   Laboratory interpretation all normal except contaminated urine    1. Delusional disorder     Plan psychiatric evaluation  Plan discharge   Devoria Albe, MD, FACEP   MDM   I personally performed the services described in this documentation, which was scribed in my presence. The recorded information has  been reviewed and considered.  Devoria Albe, MD, FACEP        [name] Givens, MD 07/20/12 1807  [name] Givens, MD 07/20/12 2004  [name] Givens, MD 07/20/12 2023

## 2012-07-20 NOTE — ED Provider Notes (Signed)
Medical screening examination/treatment/procedure(s) were performed by resident physician or non-physician practitioner and as supervising physician I was immediately available for consultation/collaboration.   Barkley Bruns MD.    Linna Hoff, MD 07/20/12 [id number]

## 2012-07-20 NOTE — ED Provider Notes (Signed)
History     CSN: [id number]  Arrival date & time 07/20/12  1114   None     Chief Complaint  Patient presents with  . Hallucinations    (Consider location/radiation/quality/duration/timing/severity/associated sxs/prior treatment) HPI Comments: This 66 year old female presents to the urgent care by older of the police department. She states the police told her to come here to get her blood pressure checked more specifically she has been experiencing strange occurrences in her apartment. These are related to electrical appliances in particular the TV and the radio. The upper abdomen to a see electricity these are throwing waves to her ears and causing her ears to range.Devices and turned off the ringing gradually abates. She's tried removing in no from the a.c. connection and placing batteries which places the batteries in the radio the ringing stops. She is not sleeping well she felt exhausted has not had any rest. Although, she had a good night sleep last night because she was wearing earplugs. She states that she has been to the police she is called home when security, the FBI, the SBI, and they have not been able to help her. She states she is in danger from right catheters Cipro bullying Cipro aspirin and unusual messages at all her computer when she wakes in the morning.   Past Medical History  Diagnosis Date  . Anemia   . Anxiety   . Panic attacks   . Ringing in ear     History reviewed. No pertinent past surgical history.  Family History  Problem Relation Age of Onset  . Family history unknown: Yes    History  Substance Use Topics  . Smoking status: Never Smoker   . Smokeless tobacco: Not on file  . Alcohol Use: No    OB History    Grav Para Term Preterm Abortions TAB SAB Ect Mult Living   3 3 3       3       Review of Systems  Constitutional: Negative.   HENT: Negative.   Respiratory: Negative.   Cardiovascular: Positive for palpitations.  Musculoskeletal:  Negative.   Psychiatric/Behavioral: Positive for hallucinations and disturbed wake/sleep cycle. The patient is nervous/anxious.        CHPI    Allergies  Review of patient's allergies indicates no known allergies.  Home Medications   Current Outpatient Rx  Name Route Sig Dispense Refill  . MULTIVITAMINS PO CAPS Oral Take 1 capsule by mouth daily. Low rion    . OMEGA 3 PO Oral Take 1 tablet by mouth daily.     Marland Kitchen PRESCRIPTION MEDICATION Oral Take 1 capsule by mouth 4 (four) times daily.      BP 118/53  Pulse 95  Temp 98.3 F (36.8 C) (Oral)  Resp 18  SpO2 100%  Physical Exam  Constitutional: She is oriented to person, place, and time. She appears well-developed and well-nourished. No distress.  HENT:  Head: Normocephalic and atraumatic.  Mouth/Throat: Oropharynx is clear and moist. No oropharyngeal exudate.  Eyes: EOM are normal. Pupils are equal, round, and reactive to light.  Neck: Normal range of motion. Neck supple.  Cardiovascular: Normal rate and normal heart sounds.   No murmur heard. Pulmonary/Chest: Effort normal and breath sounds normal. No respiratory distress.  Abdominal: Soft. There is no tenderness.  Musculoskeletal: Normal range of motion.  Lymphadenopathy:    She has no cervical adenopathy.  Neurological: She is alert and oriented to person, place, and time. No cranial nerve deficit.  Skin:  Skin is warm and dry.  Psychiatric: Her mood appears anxious. Her affect is not angry, not blunt and not inappropriate. Her speech is rapid and/or pressured. Her speech is not slurred. She is actively hallucinating. She is not aggressive and not combative. Thought content is paranoid and delusional. She expresses no homicidal and no suicidal ideation.    ED Course  Procedures (including critical care time)  Labs Reviewed - No data to display Ct Head Wo Contrast  07/18/2012  *RADIOLOGY REPORT*  Clinical Data: Near syncope.  CT HEAD WITHOUT CONTRAST  Technique:   Contiguous axial images were obtained from the base of the skull through the vertex without contrast.  Comparison: 06/26/2010  Findings: No acute intracranial abnormality.  Specifically, no hemorrhage, hydrocephalus, mass lesion, acute infarction, or significant intracranial injury.  No acute calvarial abnormality. Visualized paranasal sinuses and mastoids clear.  Orbital soft tissues unremarkable.  IMPRESSION: Normal study.   Original Report Authenticated By: Cyndie Chime, M.D.      1. Auditory hallucinations   2. Paranoia       MDM  Send down to Ed for psych consult. Tom with [facility] has been contacted.         Hayden Rasmussen, NP 07/20/12 1335

## 2012-07-20 NOTE — ED Notes (Signed)
Pt reports "i am here because I am seeking a safe place. 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 This started with my tv and they were watching me through there - so I unplugged it and then it started with my radio and I unplugged it. I went into my bedroom and they are tapping my phone like the beeping noise you hear in spy movies and they play another noise like finger nails down a chalk board. It's like cyber bullying without being on the internet. " when asked if pt knew who could by causing this activity she states " I think that it could only be my family's  Former slaveowners who think the Civil War is still going on. I feel better when I get to a safe place"

## 2012-07-28 ENCOUNTER — Ambulatory Visit: Payer: Medicare Other | Admitting: Obstetrics & Gynecology

## 2012-09-17 ENCOUNTER — Encounter: Payer: Self-pay | Admitting: *Deleted

## 2012-09-17 ENCOUNTER — Encounter: Payer: Self-pay | Admitting: Cardiovascular Disease

## 2012-09-17 DIAGNOSIS — F410 Panic disorder [episodic paroxysmal anxiety] without agoraphobia: Secondary | ICD-10-CM | POA: Insufficient documentation

## 2012-09-17 DIAGNOSIS — D649 Anemia, unspecified: Secondary | ICD-10-CM | POA: Insufficient documentation

## 2012-09-17 DIAGNOSIS — H9319 Tinnitus, unspecified ear: Secondary | ICD-10-CM | POA: Insufficient documentation

## 2012-09-17 DIAGNOSIS — F419 Anxiety disorder, unspecified: Secondary | ICD-10-CM | POA: Insufficient documentation

## 2012-09-22 ENCOUNTER — Institutional Professional Consult (permissible substitution): Payer: Medicare Other | Admitting: Cardiovascular Disease

## 2013-03-30 ENCOUNTER — Emergency Department (HOSPITAL_BASED_OUTPATIENT_CLINIC_OR_DEPARTMENT_OTHER)
Admission: EM | Admit: 2013-03-30 | Discharge: 2013-03-30 | Disposition: A | Discharge status: Home / Self Care | Payer: Medicare Other | Attending: Emergency Medicine | Admitting: Emergency Medicine

## 2013-03-30 ENCOUNTER — Encounter (HOSPITAL_BASED_OUTPATIENT_CLINIC_OR_DEPARTMENT_OTHER): Payer: Self-pay

## 2013-03-30 ENCOUNTER — Emergency Department (HOSPITAL_BASED_OUTPATIENT_CLINIC_OR_DEPARTMENT_OTHER): Payer: Medicare Other

## 2013-03-30 DIAGNOSIS — J988 Other specified respiratory disorders: Secondary | ICD-10-CM | POA: Insufficient documentation

## 2013-03-30 DIAGNOSIS — R0989 Other specified symptoms and signs involving the circulatory and respiratory systems: Secondary | ICD-10-CM

## 2013-03-30 DIAGNOSIS — R05 Cough: Secondary | ICD-10-CM | POA: Insufficient documentation

## 2013-03-30 DIAGNOSIS — R062 Wheezing: Secondary | ICD-10-CM | POA: Insufficient documentation

## 2013-03-30 DIAGNOSIS — R509 Fever, unspecified: Secondary | ICD-10-CM | POA: Insufficient documentation

## 2013-03-30 DIAGNOSIS — R059 Cough, unspecified: Secondary | ICD-10-CM | POA: Insufficient documentation

## 2013-03-30 DIAGNOSIS — Z8669 Personal history of other diseases of the nervous system and sense organs: Secondary | ICD-10-CM | POA: Insufficient documentation

## 2013-03-30 DIAGNOSIS — Z8659 Personal history of other mental and behavioral disorders: Secondary | ICD-10-CM | POA: Insufficient documentation

## 2013-03-30 DIAGNOSIS — Z862 Personal history of diseases of the blood and blood-forming organs and certain disorders involving the immune mechanism: Secondary | ICD-10-CM | POA: Insufficient documentation

## 2013-03-30 LAB — CBC WITH DIFFERENTIAL/PLATELET
Eosinophils Relative: 1 % (ref 0–5)
HCT: 34.8 % — ABNORMAL LOW (ref 36.0–46.0)
Hemoglobin: 11.3 g/dL — ABNORMAL LOW (ref 12.0–15.0)
Lymphocytes Relative: 34 % (ref 12–46)
Lymphs Abs: 1.9 10*3/uL (ref 0.7–4.0)
MCH: 28.5 pg (ref 26.0–34.0)
MCV: 87.9 fL (ref 78.0–100.0)
Monocytes Absolute: 0.6 10*3/uL (ref 0.1–1.0)
Monocytes Relative: 10 % (ref 3–12)
RBC: 3.96 MIL/uL (ref 3.87–5.11)
WBC: 5.6 10*3/uL (ref 4.0–10.5)

## 2013-03-30 LAB — BASIC METABOLIC PANEL
BUN: 11 mg/dL (ref 6–23)
CO2: 28 mEq/L (ref 19–32)
Calcium: 10.2 mg/dL (ref 8.4–10.5)
Creatinine, Ser: 0.8 mg/dL (ref 0.50–1.10)
Glucose, Bld: 98 mg/dL (ref 70–99)
Sodium: 139 mEq/L (ref 135–145)

## 2013-03-30 NOTE — ED Provider Notes (Signed)
History     CSN: [id number]  Arrival date & time 03/30/13  0803   First MD Initiated Contact with Patient 03/30/13 0830      Chief Complaint  Patient presents with  . Chest Pain    (Consider location/radiation/quality/duration/timing/severity/associated sxs/prior treatment) Patient is a 67 y.o. female presenting with chest pain. The history is provided by the patient.  Chest Pain Chest pain location: entire chest. Pain quality: tightness   Pain radiates to:  Does not radiate Pain radiates to the back: no   Pain severity:  Mild Onset quality:  Sudden Timing:  Constant Progression:  Resolved Chronicity:  New Context comment:  Occurs every morning when she wakes up for the last week.  states she had had poor air quality since she moved into this new retirement home and feels that it is triggering breathing problems. Relieved by: fresh air. Ineffective treatments:  None tried Associated symptoms: cough and shortness of breath   Associated symptoms: no abdominal pain, no dizziness, no fever, no headache, no nausea, no near-syncope, no palpitations, not vomiting and no weakness   Associated symptoms comment:  Wheezing Risk factors: no coronary artery disease, no diabetes mellitus, no prior DVT/PE, no smoking and no surgery     Past Medical History  Diagnosis Date  . Anemia   . Anxiety   . Panic attacks   . Ringing in ear     History reviewed. No pertinent past surgical history.  No family history on file.  History  Substance Use Topics  . Smoking status: Never Smoker   . Smokeless tobacco: Not on file  . Alcohol Use: No    OB History   Grav Para Term Preterm Abortions TAB SAB Ect Mult Living   3 3 3       3       Review of Systems  Constitutional: Negative for fever, appetite change and unexpected weight change.  Respiratory: Positive for cough, shortness of breath and wheezing.   Cardiovascular: Positive for chest pain. Negative for palpitations and  near-syncope.  Gastrointestinal: Negative for nausea, vomiting and abdominal pain.  Neurological: Negative for dizziness, weakness and headaches.    Allergies  Review of patient's allergies indicates no known allergies.  Home Medications  No current outpatient prescriptions on file.  BP 142/79  Pulse 73  Temp(Src) 97.8 F (36.6 C) (Oral)  Resp 16  Ht 5\' 6"  (1.676 m)  Wt 160 lb (72.576 kg)  BMI 25.84 kg/m2  SpO2 100%  Physical Exam  Nursing note and vitals reviewed. Constitutional: She is oriented to person, place, and time. She appears well-developed and well-nourished. No distress.  HENT:  Head: Normocephalic and atraumatic.  Mouth/Throat: Oropharynx is clear and moist.  Eyes: Conjunctivae and EOM are normal. Pupils are equal, round, and reactive to light.  Neck: Normal range of motion. Neck supple.  Cardiovascular: Normal rate, regular rhythm and intact distal pulses.   No murmur heard. Pulmonary/Chest: Effort normal and breath sounds normal. No respiratory distress. She has no wheezes. She has no rales.  Abdominal: Soft. She exhibits no distension. There is no tenderness. There is no rebound and no guarding.  Musculoskeletal: Normal range of motion. She exhibits no edema and no tenderness.  Neurological: She is alert and oriented to person, place, and time.  Skin: Skin is warm and dry. No rash noted. No erythema.  Psychiatric: She has a normal mood and affect. Her behavior is normal.    ED Course  Procedures (including critical care  time)  Labs Reviewed  CBC WITH DIFFERENTIAL - Abnormal; Notable for the following:    Hemoglobin 11.3 (*)    HCT 34.8 (*)    All other components within normal limits  BASIC METABOLIC PANEL - Abnormal; Notable for the following:    GFR calc non Af Amer 74 (*)    GFR calc Af Amer 86 (*)    All other components within normal limits  TROPONIN I   Dg Chest 2 View  03/30/2013   *RADIOLOGY REPORT*  Clinical Data: Shortness of breath   CHEST - 2 VIEW  Comparison: 12/30/2009  Findings: The heart and pulmonary vascularity are within normal limits.  The lungs are clear bilaterally.  No acute bony abnormality is seen.  IMPRESSION: No acute abnormality noted.   Original Report Authenticated By: Alcide Clever, M.D.    Date: 03/30/2013  Rate: 78  Rhythm: normal sinus rhythm  QRS Axis: normal  Intervals: normal  ST/T Wave abnormalities: nonspecific ST changes with early repolarization  Conduction Disutrbances:none  Narrative Interpretation:   Old EKG Reviewed: unchanged    1. Reactive airway disease that is not asthma       MDM   Patient here today complaining of shortness of breath, wheezing and chest tightness started this morning when she woke up. She feels that the air quality in her home meds for and that whatever she is breathing and is causing her to have shortness of breath. This has been ongoing for multiple days and has appointments to followup with her cardiologist and pulmonologist in the future but when she called the nurse today they've requested that she come here for evaluation. She states now she feels normal. EKG shows early repolarization but is otherwise within normal limits and is unchanged from prior EKGs. Chest x-ray, CBC, BMP and troponin are all within normal limits. Patient does not want an inhaler at this time but will followup with primary physician        Gwyneth Sprout, MD 03/30/13 1018

## 2013-03-30 NOTE — ED Notes (Signed)
Pt reports she awakened at 0430am with generalized dizziness, chest "pain" and throat tightness.  She also reports a strange chemical smell in her apartment.

## 2013-04-27 ENCOUNTER — Ambulatory Visit: Payer: Medicare Other | Admitting: Family Medicine

## 2013-05-09 ENCOUNTER — Encounter (HOSPITAL_BASED_OUTPATIENT_CLINIC_OR_DEPARTMENT_OTHER): Payer: Self-pay | Admitting: *Deleted

## 2013-05-09 ENCOUNTER — Emergency Department (HOSPITAL_BASED_OUTPATIENT_CLINIC_OR_DEPARTMENT_OTHER)
Admission: EM | Admit: 2013-05-09 | Discharge: 2013-05-09 | Disposition: A | Discharge status: Home / Self Care | Payer: Medicare Other | Attending: Emergency Medicine | Admitting: Emergency Medicine

## 2013-05-09 ENCOUNTER — Emergency Department (HOSPITAL_BASED_OUTPATIENT_CLINIC_OR_DEPARTMENT_OTHER): Payer: Medicare Other

## 2013-05-09 DIAGNOSIS — M19049 Primary osteoarthritis, unspecified hand: Secondary | ICD-10-CM | POA: Insufficient documentation

## 2013-05-09 DIAGNOSIS — J45909 Unspecified asthma, uncomplicated: Secondary | ICD-10-CM | POA: Insufficient documentation

## 2013-05-09 DIAGNOSIS — Z862 Personal history of diseases of the blood and blood-forming organs and certain disorders involving the immune mechanism: Secondary | ICD-10-CM | POA: Insufficient documentation

## 2013-05-09 DIAGNOSIS — Z8669 Personal history of other diseases of the nervous system and sense organs: Secondary | ICD-10-CM | POA: Insufficient documentation

## 2013-05-09 DIAGNOSIS — Z8659 Personal history of other mental and behavioral disorders: Secondary | ICD-10-CM | POA: Insufficient documentation

## 2013-05-09 DIAGNOSIS — F22 Delusional disorders: Secondary | ICD-10-CM | POA: Insufficient documentation

## 2013-05-09 DIAGNOSIS — M1990 Unspecified osteoarthritis, unspecified site: Secondary | ICD-10-CM

## 2013-05-09 HISTORY — DX: Unspecified asthma, uncomplicated: J45.909
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
## 2013-05-20 ENCOUNTER — Institutional Professional Consult (permissible substitution): Payer: Medicare Other | Admitting: Cardiology

## 2013-05-25 ENCOUNTER — Ambulatory Visit: Payer: Medicare Other | Admitting: Family Medicine

## 2013-05-26 ENCOUNTER — Ambulatory Visit: Payer: Medicare Other | Admitting: Family Medicine

## 2013-07-22 ENCOUNTER — Encounter (HOSPITAL_BASED_OUTPATIENT_CLINIC_OR_DEPARTMENT_OTHER): Payer: Self-pay | Admitting: Emergency Medicine

## 2013-07-22 ENCOUNTER — Emergency Department (HOSPITAL_BASED_OUTPATIENT_CLINIC_OR_DEPARTMENT_OTHER)
Admission: EM | Admit: 2013-07-22 | Discharge: 2013-07-22 | Disposition: A | Discharge status: Home / Self Care | Payer: Medicare Other | Attending: Emergency Medicine | Admitting: Emergency Medicine

## 2013-07-22 DIAGNOSIS — N390 Urinary tract infection, site not specified: Secondary | ICD-10-CM

## 2013-07-22 DIAGNOSIS — F22 Delusional disorders: Secondary | ICD-10-CM | POA: Insufficient documentation

## 2013-07-22 DIAGNOSIS — J45909 Unspecified asthma, uncomplicated: Secondary | ICD-10-CM | POA: Insufficient documentation

## 2013-07-22 DIAGNOSIS — Z8659 Personal history of other mental and behavioral disorders: Secondary | ICD-10-CM | POA: Insufficient documentation

## 2013-07-22 DIAGNOSIS — B379 Candidiasis, unspecified: Secondary | ICD-10-CM | POA: Insufficient documentation

## 2013-07-22 DIAGNOSIS — Z862 Personal history of diseases of the blood and blood-forming organs and certain disorders involving the immune mechanism: Secondary | ICD-10-CM | POA: Insufficient documentation

## 2013-07-22 LAB — CBC WITH DIFFERENTIAL/PLATELET
Basophils Absolute: 0 10*3/uL (ref 0.0–0.1)
Basophils Relative: 0 % (ref 0–1)
Hemoglobin: 11.5 g/dL — ABNORMAL LOW (ref 12.0–15.0)
Lymphs Abs: 1.5 10*3/uL (ref 0.7–4.0)
MCHC: 32.2 g/dL (ref 30.0–36.0)
Monocytes Relative: 7 % (ref 3–12)
Neutro Abs: 5.9 10*3/uL (ref 1.7–7.7)
Neutrophils Relative %: 74 % (ref 43–77)
Platelets: 249 10*3/uL (ref 150–400)
RBC: 4.1 MIL/uL (ref 3.87–5.11)

## 2013-07-22 LAB — URINE MICROSCOPIC-ADD ON

## 2013-07-22 LAB — BASIC METABOLIC PANEL
CO2: 28 mEq/L (ref 19–32)
Chloride: 103 mEq/L (ref 96–112)
GFR calc Af Amer: 86 mL/min — ABNORMAL LOW (ref >90)
Potassium: 3.5 mEq/L (ref 3.5–5.1)
Sodium: 141 mEq/L (ref 135–145)

## 2013-07-22 LAB — URINALYSIS, ROUTINE W REFLEX MICROSCOPIC
Glucose, UA: NEGATIVE mg/dL
Ketones, ur: NEGATIVE mg/dL
Protein, ur: NEGATIVE mg/dL

## 2013-07-22 MED ORDER — PHENAZOPYRIDINE HCL 95 MG PO TABS: 95.0000 mg | ORAL_TABLET | Freq: Three times a day (TID) | ORAL | Status: DC | PRN

## 2013-07-22 MED ORDER — SULFAMETHOXAZOLE-TRIMETHOPRIM 800-160 MG PO TABS: 1.0000 | ORAL_TABLET | Freq: Two times a day (BID) | ORAL | Status: DC

## 2013-07-22 MED ORDER — SULFAMETHOXAZOLE-TMP DS 800-160 MG PO TABS
1.0000 | ORAL_TABLET | Freq: Once | ORAL | Status: AC
  Administered 2013-07-22: 1 via ORAL
  Filled 2013-07-22: qty 1

## 2013-07-22 MED ORDER — FLUCONAZOLE 100 MG PO TABS
100.0000 mg | ORAL_TABLET | Freq: Once | ORAL | Status: AC
  Administered 2013-07-22: 100 mg via ORAL
  Filled 2013-07-22: qty 1

## 2013-07-22 NOTE — ED Notes (Signed)
Pt reports "burning and pressure" in lower abdomen, groin and L lower back. Has Hx of UTI and states "this is different". Reports dysuria as well.
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
## 2013-07-24 LAB — URINE CULTURE

## 2013-07-25 ENCOUNTER — Telehealth (HOSPITAL_COMMUNITY): Payer: Self-pay | Admitting: Emergency Medicine

## 2013-07-25 NOTE — ED Notes (Signed)
Post ED Visit - Positive Culture Follow-up  Culture report reviewed by antimicrobial stewardship pharmacist: []  Wes Dulaney, Pharm.D., BCPS []  Celedonio Miyamoto, Pharm.D., BCPS []  Georgina Pillion, [address].D., BCPS []  [place], [address].D., BCPS, AAHIVP []  Estella Husk, Pharm.D., BCPS, AAHIVP [x]  Abran Duke, [address].D., BCPS  Positive urine culture Treated with Sulfa-Trimeth, organism sensitive to the same and no further patient follow-up is required at this time.  Kylie A Holland 07/25/2013, 3:59 PM

## 2013-12-15 ENCOUNTER — Inpatient Hospital Stay (HOSPITAL_COMMUNITY): Admission: AD | Admit: 2013-12-15 | Payer: Medicare Other | Source: Intra-hospital | Admitting: Psychiatry

## 2013-12-15 ENCOUNTER — Inpatient Hospital Stay (HOSPITAL_COMMUNITY)
Admission: AD | Admit: 2013-12-15 | Discharge: 2013-12-15 | Disposition: A | Discharge status: Home / Self Care | Payer: Medicare Other | Source: Ambulatory Visit | Attending: Family Medicine | Admitting: Family Medicine

## 2013-12-15 ENCOUNTER — Encounter (HOSPITAL_COMMUNITY): Payer: Self-pay | Admitting: General Practice

## 2013-12-15 DIAGNOSIS — D649 Anemia, unspecified: Secondary | ICD-10-CM

## 2013-12-15 DIAGNOSIS — F410 Panic disorder [episodic paroxysmal anxiety] without agoraphobia: Secondary | ICD-10-CM | POA: Insufficient documentation

## 2013-12-15 DIAGNOSIS — N9489 Other specified conditions associated with female genital organs and menstrual cycle: Secondary | ICD-10-CM

## 2013-12-15 DIAGNOSIS — F22 Delusional disorders: Secondary | ICD-10-CM | POA: Insufficient documentation

## 2013-12-15 DIAGNOSIS — F411 Generalized anxiety disorder: Secondary | ICD-10-CM | POA: Insufficient documentation

## 2013-12-15 DIAGNOSIS — F29 Unspecified psychosis not due to a substance or known physiological condition: Secondary | ICD-10-CM | POA: Insufficient documentation

## 2013-12-15 DIAGNOSIS — N949 Unspecified condition associated with female genital organs and menstrual cycle: Secondary | ICD-10-CM | POA: Insufficient documentation

## 2013-12-15 DIAGNOSIS — N898 Other specified noninflammatory disorders of vagina: Secondary | ICD-10-CM | POA: Insufficient documentation

## 2013-12-15 HISTORY — DX: Anemia, unspecified: D64.9

## 2013-12-15 LAB — COMPREHENSIVE METABOLIC PANEL
ALBUMIN: 4.4 g/dL (ref 3.5–5.2)
ALT: <5 U/L (ref 0–35)
AST: 21 U/L (ref 0–37)
Alkaline Phosphatase: 77 U/L (ref 39–117)
BUN: 10 mg/dL (ref 6–23)
CO2: 27 mEq/L (ref 19–32)
Calcium: 10.2 mg/dL (ref 8.4–10.5)
Chloride: 98 mEq/L (ref 96–112)
Creatinine, Ser: 0.71 mg/dL (ref 0.50–1.10)
GFR calc Af Amer: >90 mL/min (ref >90)
GFR calc non Af Amer: 87 mL/min — ABNORMAL LOW (ref >90)
Glucose, Bld: 107 mg/dL — ABNORMAL HIGH (ref 70–99)
Potassium: 3.5 mEq/L — ABNORMAL LOW (ref 3.7–5.3)
SODIUM: 139 meq/L (ref 137–147)
TOTAL PROTEIN: 7.9 g/dL (ref 6.0–8.3)
Total Bilirubin: 0.7 mg/dL (ref 0.3–1.2)

## 2013-12-15 LAB — WET PREP, GENITAL
CLUE CELLS WET PREP: NONE SEEN
TRICH WET PREP: NONE SEEN
Yeast Wet Prep HPF POC: NONE SEEN

## 2013-12-15 LAB — URINALYSIS, ROUTINE W REFLEX MICROSCOPIC
Bilirubin Urine: NEGATIVE
GLUCOSE, UA: NEGATIVE mg/dL
Hgb urine dipstick: NEGATIVE
KETONES UR: 15 mg/dL — AB
Leukocytes, UA: NEGATIVE
Nitrite: NEGATIVE
PH: 6 (ref 5.0–8.0)
Protein, ur: NEGATIVE mg/dL
Specific Gravity, Urine: 1.015 (ref 1.005–1.030)
Urobilinogen, UA: 0.2 mg/dL (ref 0.0–1.0)

## 2013-12-15 LAB — CBC
HEMATOCRIT: 38.9 % (ref 36.0–46.0)
Hemoglobin: 12.7 g/dL (ref 12.0–15.0)
MCH: 28.1 pg (ref 26.0–34.0)
MCHC: 32.6 g/dL (ref 30.0–36.0)
MCV: 86.1 fL (ref 78.0–100.0)
Platelets: 268 10*3/uL (ref 150–400)
RBC: 4.52 MIL/uL (ref 3.87–5.11)
RDW: 14.2 % (ref 11.5–15.5)
WBC: 6.5 10*3/uL (ref 4.0–10.5)

## 2013-12-15 LAB — RAPID URINE DRUG SCREEN, HOSP PERFORMED
Amphetamines: NOT DETECTED
BARBITURATES: NOT DETECTED
BENZODIAZEPINES: NOT DETECTED
COCAINE: NOT DETECTED
Opiates: NOT DETECTED
Tetrahydrocannabinol: NOT DETECTED

## 2013-12-15 LAB — ETHANOL
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
## 2013-12-15 NOTE — Discharge Instructions (Signed)
Pap Test A Pap test checks the cells on the surface of your cervix. Your doctor will look for cell changes that are not normal, an infection, or cancer. If the cells no longer look normal, it is called dysplasia. Dysplasia can turn into cancer. Regular Pap tests are important to stop cancer from developing. BEFORE THE PROCEDURE  Ask your doctor when to schedule your Pap test. Timing the test around your period may be important.  Do not douche or have sex (intercourse) for 24 hours before the test.  Do not put creams on your vagina or use tampons for 24 hours before the test.  Go pee (urinate) just before the test. PROCEDURE  You will lie on an exam table with your feet in stirrups.  A warm metal or plastic tool (speculum) will be put in your vagina to open it up.  Your doctor will use a small, plastic brush or wooden spatula to take cells from your cervix.  The cells will be put in a lab container.  The cells will be checked under a microscope to see if they are normal or not. AFTER THE PROCEDURE Get your test results. If they are abnormal, you may need more tests. Document Released: 11/10/2010 Document Revised: 12/31/2011 Document Reviewed: 10/04/2011 Exit[employer] Patient Information 2014 [employer].  
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
## 2013-12-15 NOTE — BH Assessment (Signed)
[facility] Assessment Progress Note  At 15:35 I spoke to [facility] Muhammad, [place] in anticipation of TTS assessment scheduled for 15:50.  Doylene Canninghomas Jamica Woodyard, MA Triage Specialist 12/15/2013 @ 15:48

## 2013-12-16 NOTE — MAU Provider Note (Signed)
Attestation of Attending Supervision of Advanced Practitioner (PA/CNM/NP): Evaluation and management procedures were performed by the Advanced Practitioner under my supervision and collaboration.  I have reviewed the Advanced Practitioner's note and chart, and I agree with the management and plan.  Reva BoresPRATT,TANYA S, MD [facility] Attending 12/16/2013 6:44 AM

## 2013-12-31 ENCOUNTER — Telehealth: Payer: Self-pay | Admitting: Family

## 2013-12-31 NOTE — Telephone Encounter (Signed)
I called called the patient and it went to Voicemail, left a message to call us back to rsch her Appt. I then called her daughter and left a message with her to give a message to her mother to call us back to get her another Appt.

## 2014-01-01 ENCOUNTER — Telehealth: Payer: Self-pay | Admitting: Family

## 2014-01-01 NOTE — Telephone Encounter (Signed)
PATIENT CALLED ME THIS MORNING AND SAID THE REASON SHE DON'T NEED AN APTT WITH IS BECAUSE SHE IS MOVING OUT OF THE STATE.

## 2014-01-03 IMAGING — CR DG CHEST 2V
2 series · 2 of 2 positions shown · non-contrast
Comparison: 12/30/2009

CLINICAL DATA: Shortness of breath

CHEST - 2 VIEW

[w chest pa]
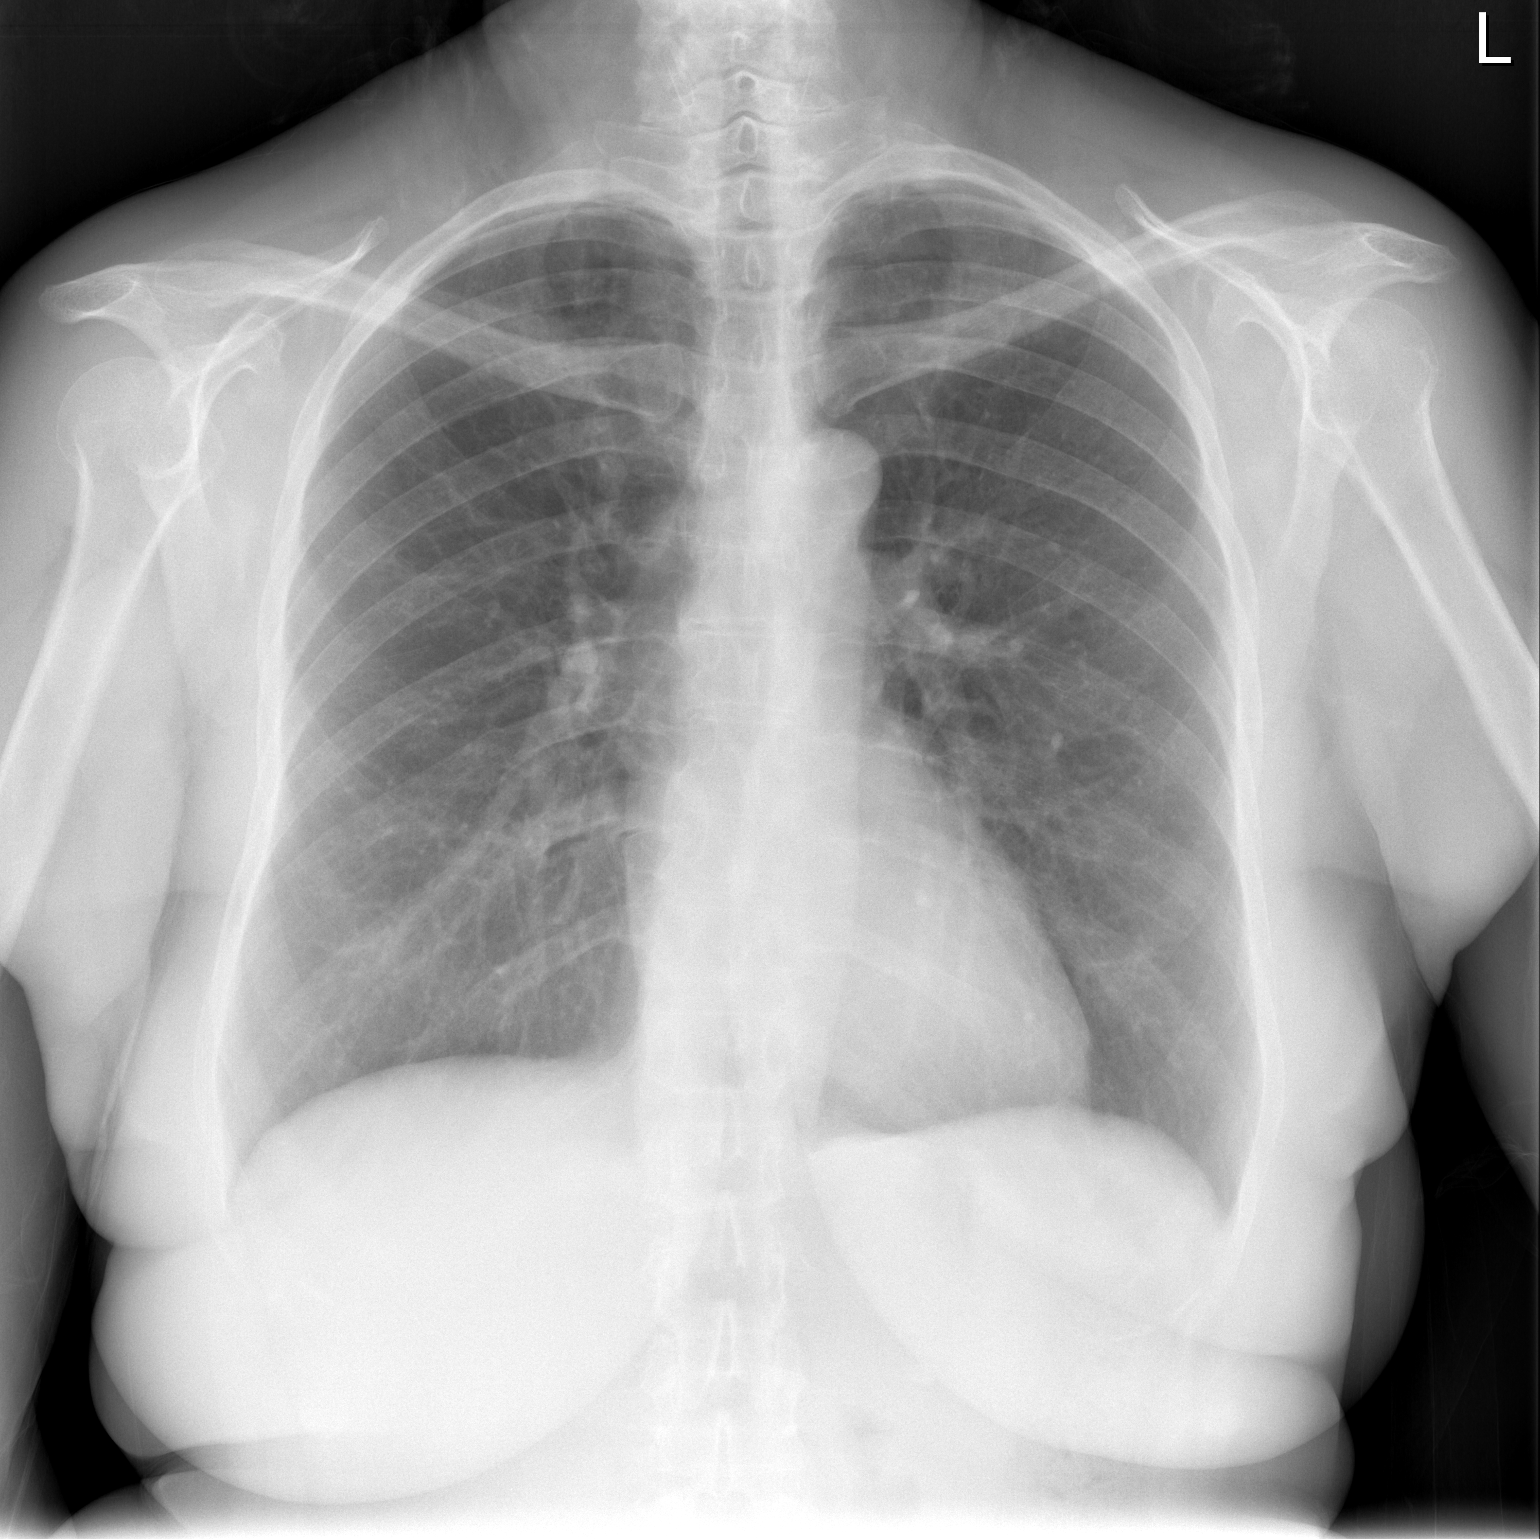

[w chest lat]
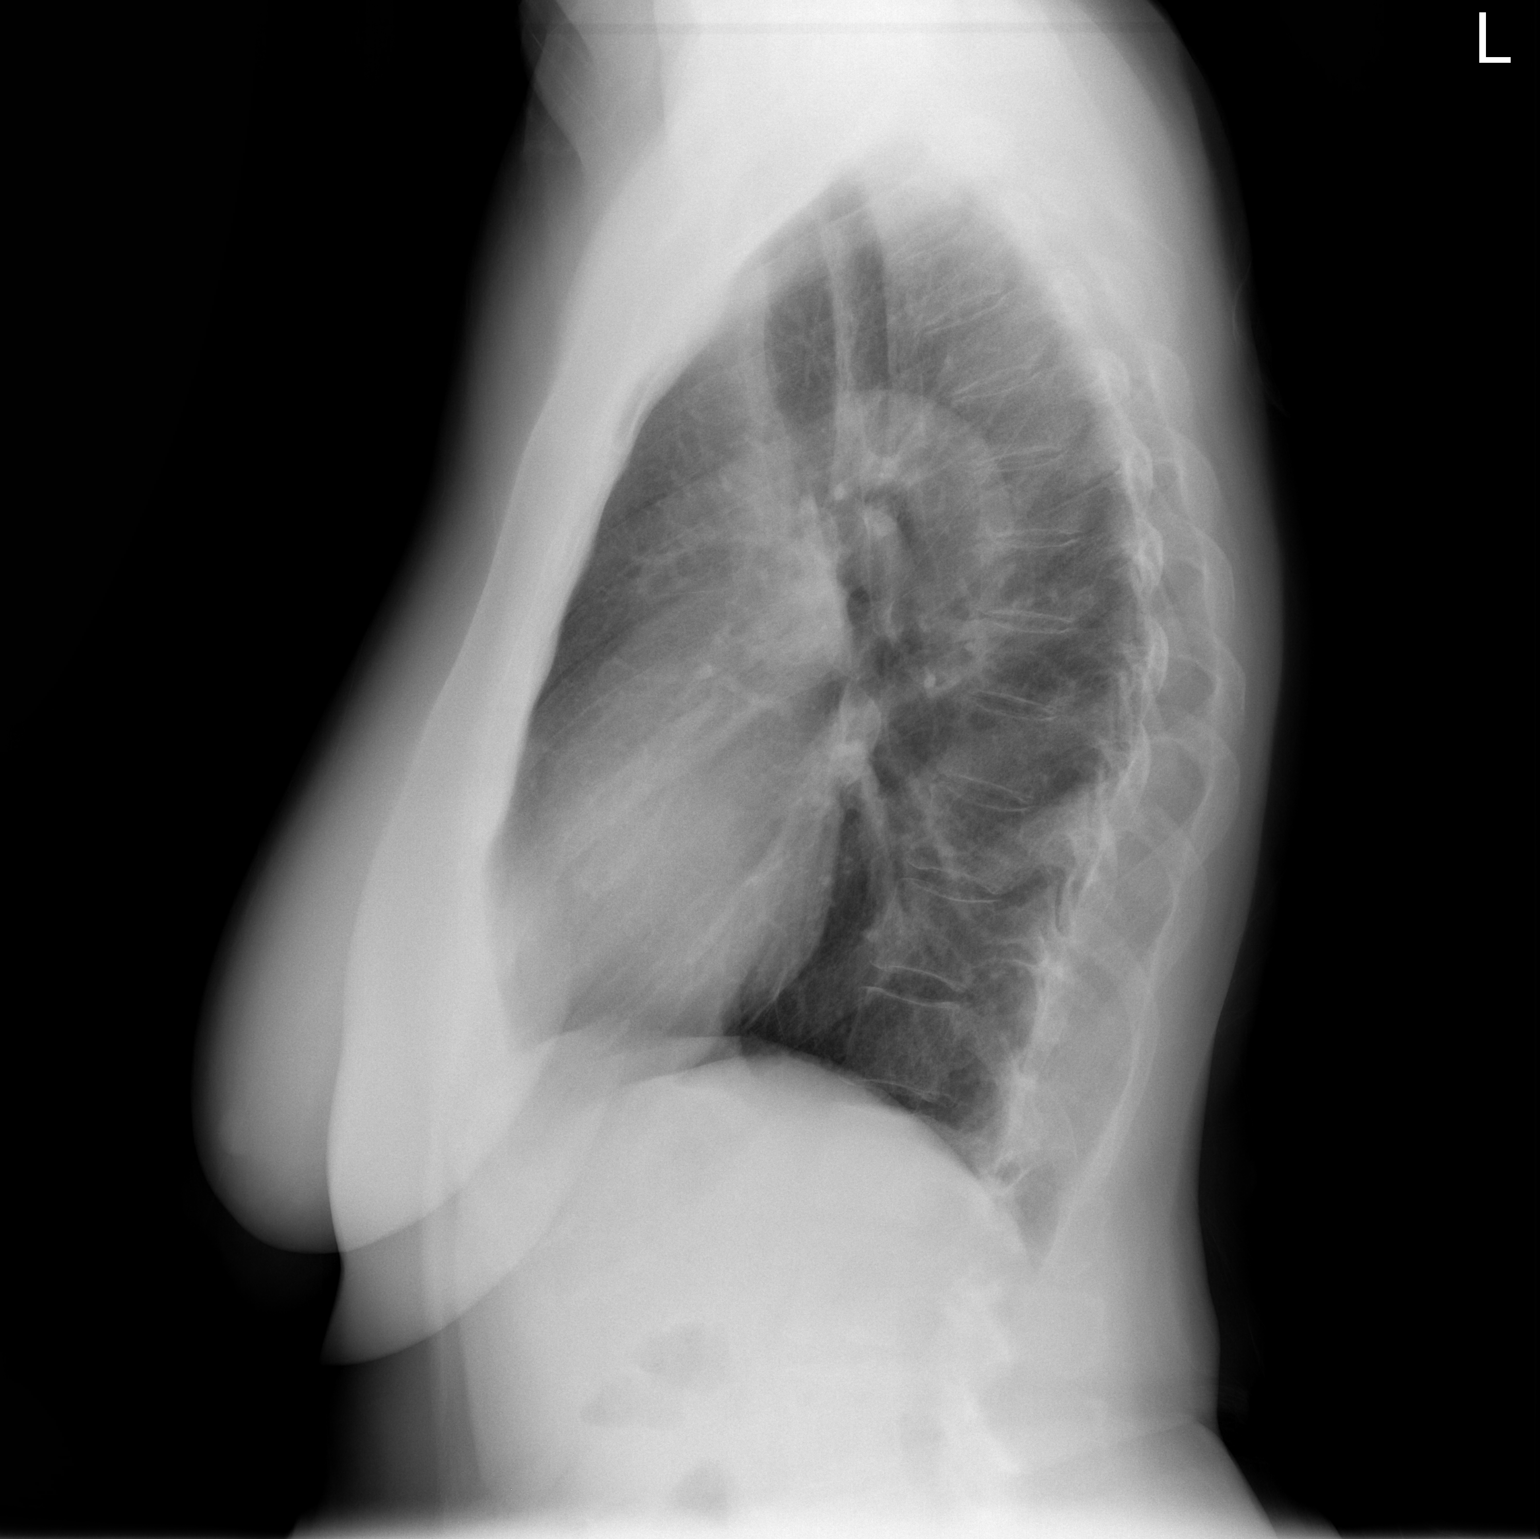

[2 of 2 positions shown; findings below may reference images not displayed]

FINDINGS: The heart and pulmonary vascularity are within normal
limits.  The lungs are clear bilaterally.  No acute bony
abnormality is seen.
IMPRESSION: No acute abnormality noted.

## 2014-02-08 ENCOUNTER — Encounter: Payer: Self-pay | Admitting: Medical

## 2014-02-12 IMAGING — CR DG HAND COMPLETE 3+V*R*
3 series · 3 of 3 positions shown · non-contrast
Comparison: None.

CLINICAL DATA: Pain and swelling right hand.

RIGHT HAND - COMPLETE 3+ VIEW

[x hand pa right]
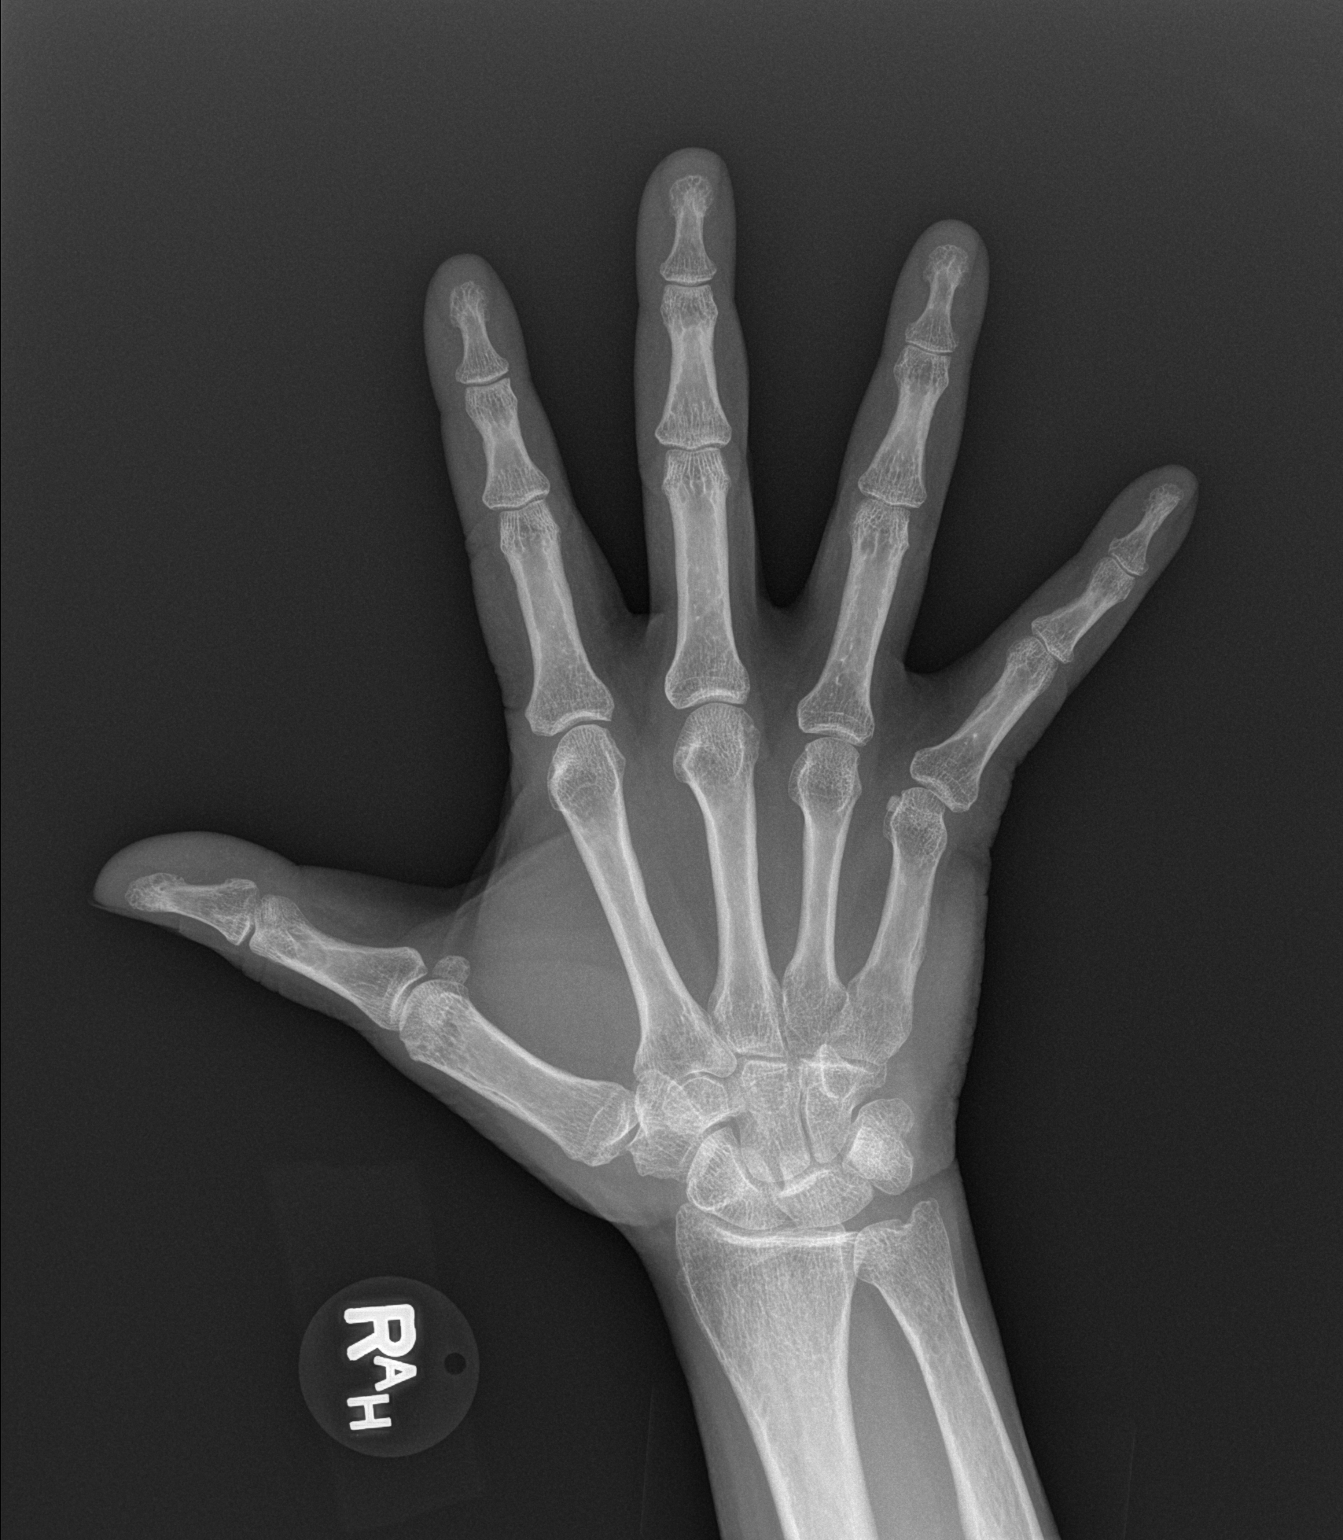

[x hand oblique right]
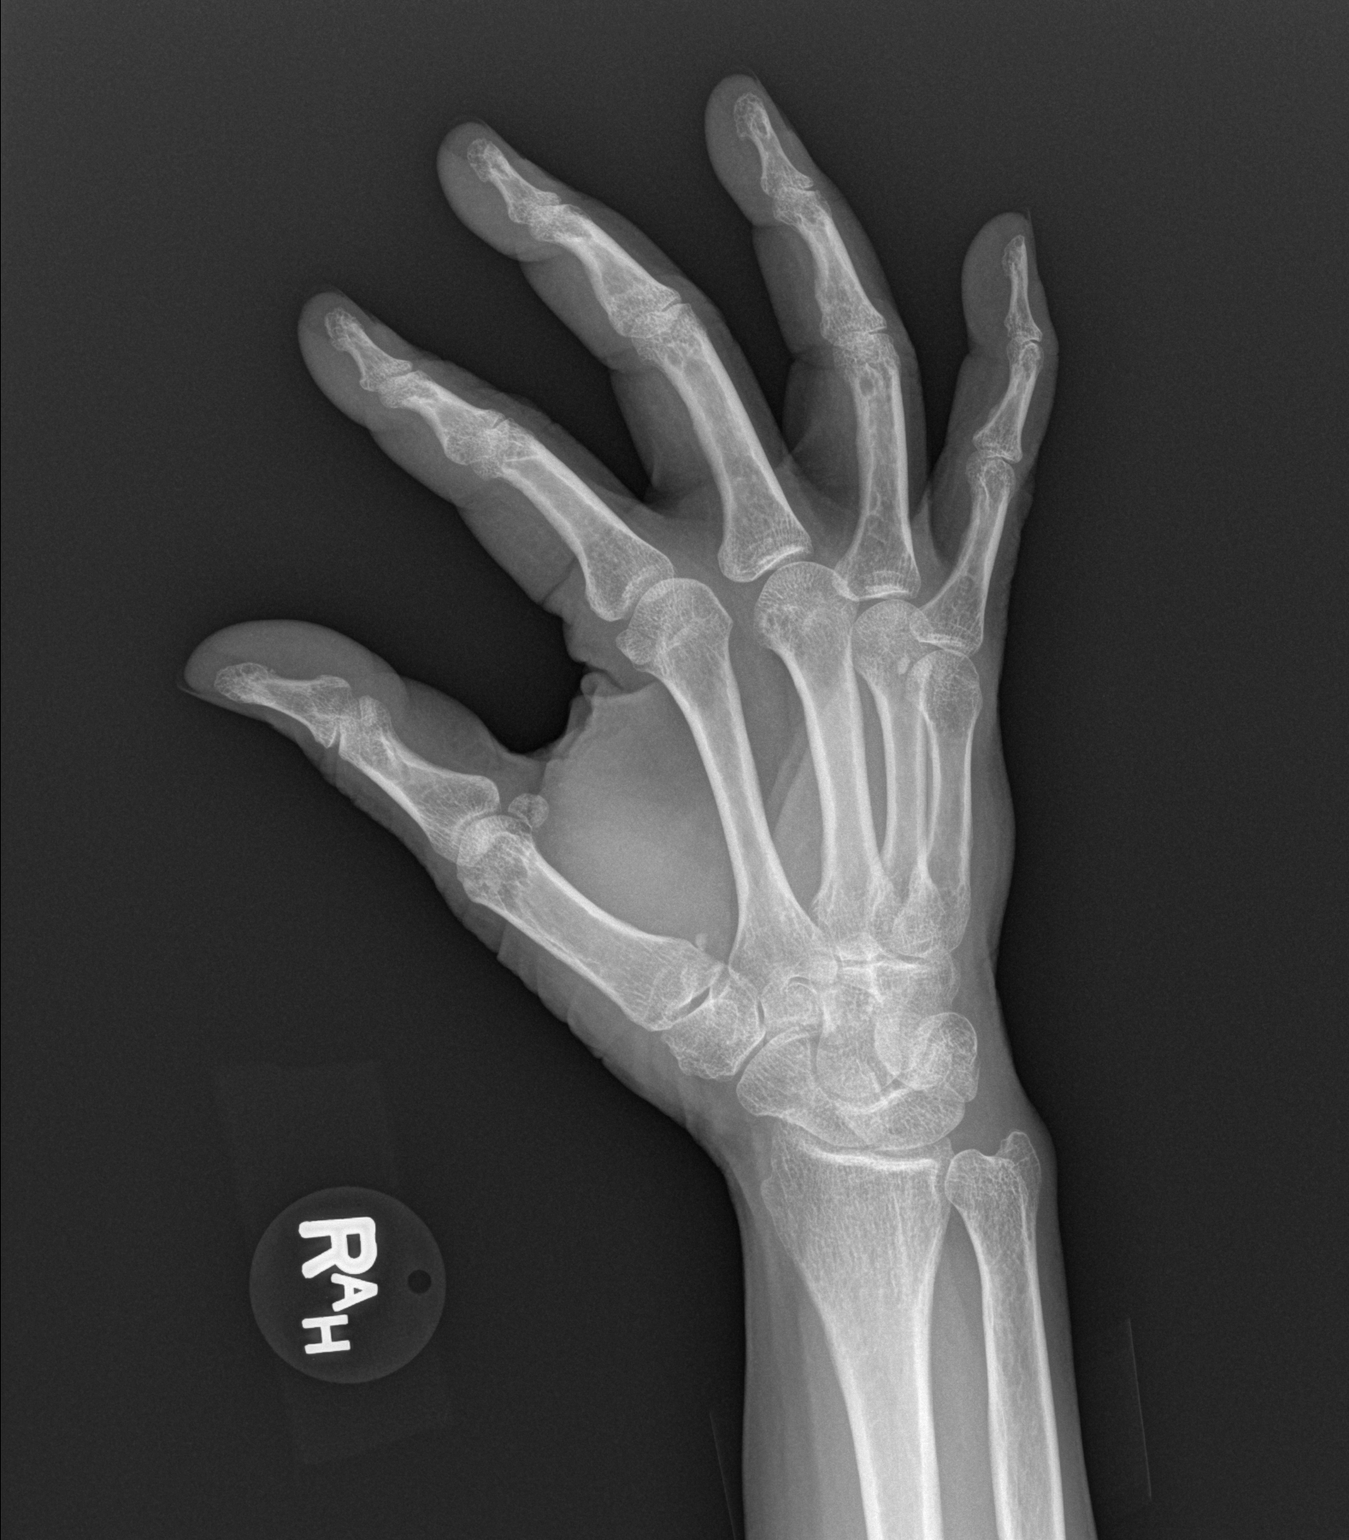

[x hand lat right]
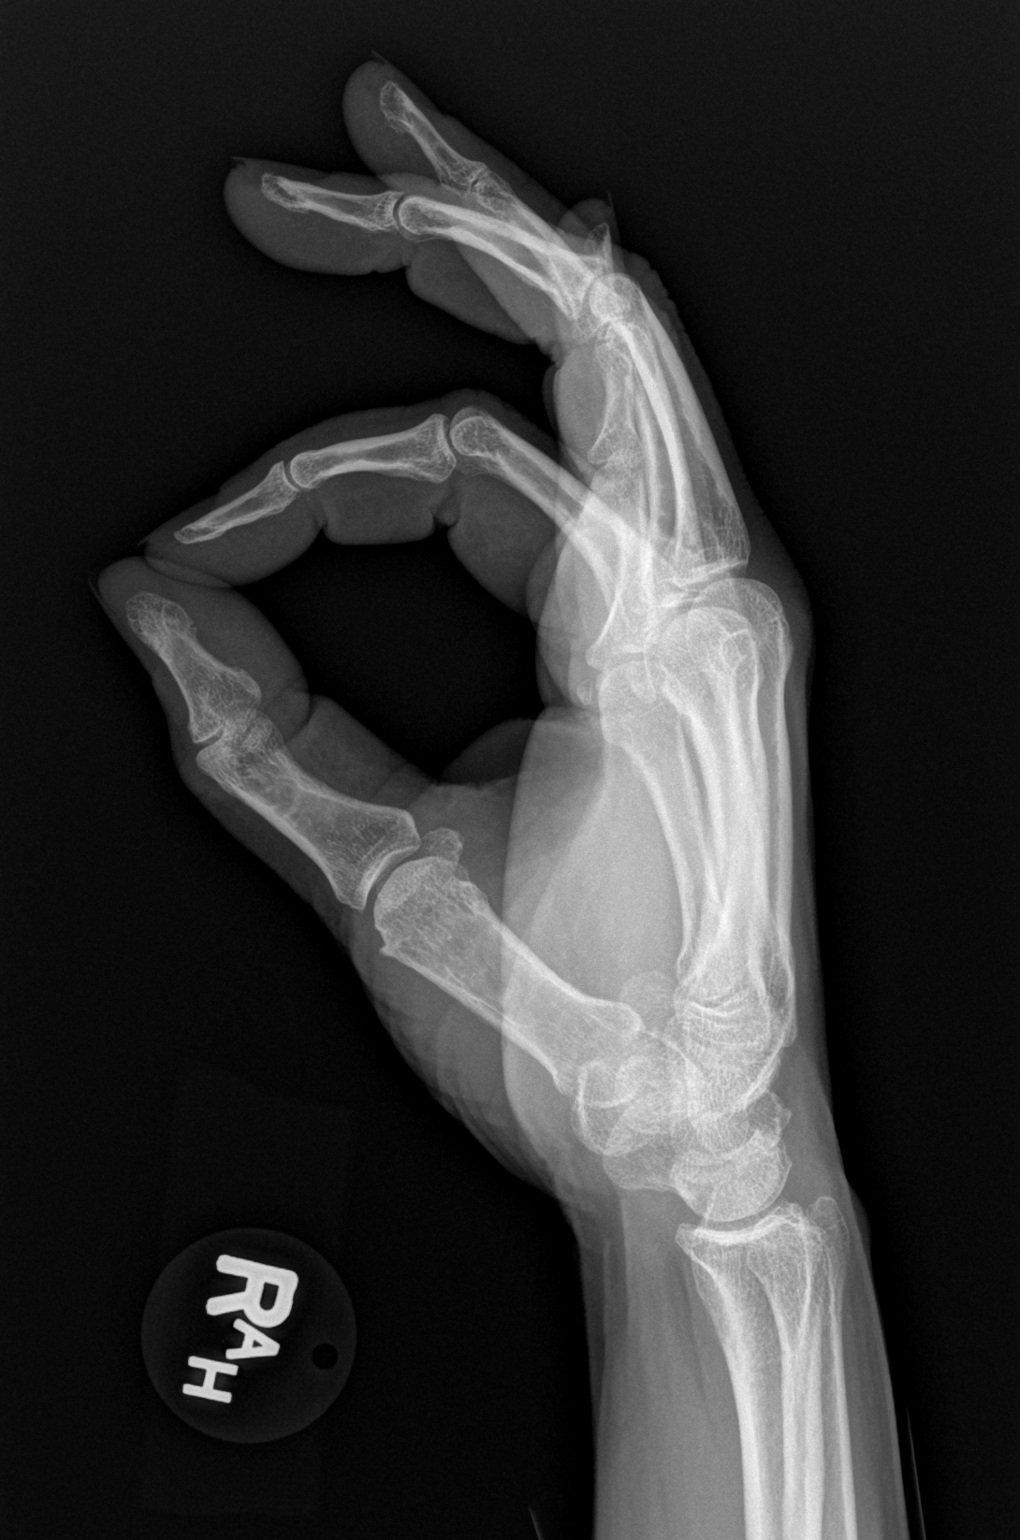

[3 of 3 positions shown; findings below may reference images not displayed]

FINDINGS: No acute bony or joint abnormality is identified.  Mild
first CMC osteoarthritis is seen.  Soft tissue structures are
unremarkable.
IMPRESSION: No acute finding.

## 2014-04-27 ENCOUNTER — Emergency Department (HOSPITAL_BASED_OUTPATIENT_CLINIC_OR_DEPARTMENT_OTHER)
Admission: EM | Admit: 2014-04-27 | Discharge: 2014-04-27 | Disposition: A | Discharge status: Home / Self Care | Payer: Medicare Other | Attending: Emergency Medicine | Admitting: Emergency Medicine

## 2014-04-27 ENCOUNTER — Encounter (HOSPITAL_BASED_OUTPATIENT_CLINIC_OR_DEPARTMENT_OTHER): Payer: Self-pay | Admitting: Emergency Medicine

## 2014-04-27 DIAGNOSIS — J45909 Unspecified asthma, uncomplicated: Secondary | ICD-10-CM | POA: Insufficient documentation

## 2014-04-27 DIAGNOSIS — Z8669 Personal history of other diseases of the nervous system and sense organs: Secondary | ICD-10-CM | POA: Diagnosis not present

## 2014-04-27 DIAGNOSIS — Z862 Personal history of diseases of the blood and blood-forming organs and certain disorders involving the immune mechanism: Secondary | ICD-10-CM | POA: Insufficient documentation

## 2014-04-27 DIAGNOSIS — N3090 Cystitis, unspecified without hematuria: Secondary | ICD-10-CM | POA: Diagnosis not present

## 2014-04-27 DIAGNOSIS — Z8659 Personal history of other mental and behavioral disorders: Secondary | ICD-10-CM | POA: Insufficient documentation

## 2014-04-27 DIAGNOSIS — R300 Dysuria: Secondary | ICD-10-CM | POA: Diagnosis present

## 2014-04-27 LAB — URINALYSIS, ROUTINE W REFLEX MICROSCOPIC
BILIRUBIN URINE: NEGATIVE
GLUCOSE, UA: NEGATIVE mg/dL
Hgb urine dipstick: NEGATIVE
KETONES UR: NEGATIVE mg/dL
Nitrite: NEGATIVE
PH: 6.5 (ref 5.0–8.0)
Protein, ur: NEGATIVE mg/dL
Specific Gravity, Urine: 1.023 (ref 1.005–1.030)
Urobilinogen, UA: 0.2 mg/dL (ref 0.0–1.0)

## 2014-04-27 LAB — URINE MICROSCOPIC-ADD ON

## 2014-04-27 MED ORDER — CEPHALEXIN 500 MG PO CAPS: 500.0000 mg | ORAL_CAPSULE | Freq: Two times a day (BID) | ORAL | Status: DC

## 2014-04-27 MED ORDER — CEPHALEXIN 250 MG PO CAPS
500.0000 mg | ORAL_CAPSULE | Freq: Once | ORAL | Status: AC
Start: 2014-04-27 — End: 2014-04-27
  Administered 2014-04-27: 500 mg via ORAL
  Filled 2014-04-27: qty 2

## 2014-04-27 NOTE — Discharge Instructions (Signed)

## 2014-04-27 NOTE — ED Provider Notes (Signed)
CSN: [id number]     Arrival date & time 04/27/14  2024 History  This chart was scribed for Shon Batonourtney F Edelyn Heidel, MD by Bronson CurbJacqueline Melvin, ED Scribe. This patient was seen in room MH06/MH06 and the patient's care was started at 9:45 PM.     Chief Complaint  Patient presents with  . Dysuria      The history is provided by the patient. No language interpreter was used.    HPI Comments: [name] [name] is a 68 y.o. female who presents to the Emergency Department complaining of dysuria that began last night. Patient states suspects UTI. She states she was recently diagnosed with an UTI and was prescribed Clindamycin as treatment. She reports she had an allergic reaction to the antibiotic and was unable to finish her treatment. She denies fever or hematuria. Patient has history of anemia, anxiety, panic attacks, reactive airway disease, and ringing in ear.   Past Medical History  Diagnosis Date  . Anemia   . Anxiety   . Panic attacks   . Ringing in ear   . Reactive airway disease   . Anemia 12/15/2013    By history, per pt.   History reviewed. No pertinent past surgical history. History reviewed. No pertinent family history. History  Substance Use Topics  . Smoking status: Never Smoker   . Smokeless tobacco: Never Used  . Alcohol Use: No   OB History   Grav Para Term Preterm Abortions TAB SAB Ect Mult Living   3 3 3       3      Review of Systems  Constitutional: Negative for fever.  Genitourinary: Positive for dysuria.  All other systems reviewed and are negative.     Allergies  Clindamycin/lincomycin  Home Medications   Prior to Admission medications   Medication Sig Start Date End Date Taking? Authorizing Provider  cephALEXin (KEFLEX) 500 MG capsule Take 1 capsule (500 mg total) by mouth 2 (two) times daily. 04/27/14   Shon Batonourtney F Ulyses Panico, MD   Triage Vitals: BP 121/76  Pulse 95  Temp(Src) 98 F (36.7 C) (Oral)  Resp 16  SpO2 98%  Physical Exam  Nursing note and  vitals reviewed. Constitutional: She is oriented to person, place, and time. She appears well-developed and well-nourished. No distress.  HENT:  Head: Normocephalic and atraumatic.  Cardiovascular: Normal rate, regular rhythm and normal heart sounds.   No murmur heard. Pulmonary/Chest: Effort normal and breath sounds normal. No respiratory distress.  Abdominal: Soft. Bowel sounds are normal. There is no tenderness. There is no rebound and no guarding.  Neurological: She is alert and oriented to person, place, and time.  Skin: Skin is warm and dry.  Psychiatric: She has a normal mood and affect.    ED Course  Procedures (including critical care time)  DIAGNOSTIC STUDIES: Oxygen Saturation is 98% on room air, normal by my interpretation.    COORDINATION OF CARE: At 2152 Discussed treatment plan with patient which includes Keflex. Patient agrees.   Labs Review Labs Reviewed  URINALYSIS, ROUTINE W REFLEX MICROSCOPIC - Abnormal; Notable for the following:    APPearance CLOUDY (*)    Leukocytes, UA LARGE (*)    All other components within normal limits  URINE MICROSCOPIC-ADD ON - Abnormal; Notable for the following:    Squamous Epithelial / LPF FEW (*)    Bacteria, UA FEW (*)    All other components within normal limits  URINE CULTURE    Imaging Review No results found.  EKG Interpretation None      MDM   Final diagnoses:  Cystitis    Patient presents with dysuria. Denies systemic symptoms. History of UTI in the past and states this is the same. She is otherwise nontoxic on exam. Vital signs are reassuring. She is afebrile. Urinalysis shows evidence of 2 numerous to count white cells and few bacteria. Prior cultures have been susceptible to cephalosporins. Patient was given a dose of Keflex. Given recent UTI, will place on a seven-day course of antibiotics.  Patient to followup with PCP. Urine culture pending.  After history, exam, and medical workup I feel the patient  has been appropriately medically screened and is safe for discharge home. Pertinent diagnoses were discussed with the patient. Patient was given return precautions.  I personally performed the services described in this documentation, which was scribed in my presence. The recorded information has been reviewed and is accurate.    Shon Batonourtney F Akil Hoos, MD 04/27/14 [phone]

## 2014-04-27 NOTE — ED Notes (Signed)
Dysuria since last night, denies fever

## 2014-04-28 ENCOUNTER — Emergency Department (HOSPITAL_BASED_OUTPATIENT_CLINIC_OR_DEPARTMENT_OTHER)
Admission: EM | Admit: 2014-04-28 | Discharge: 2014-04-28 | Disposition: A | Discharge status: Home / Self Care | Payer: Medicare Other | Attending: Emergency Medicine | Admitting: Emergency Medicine

## 2014-04-28 ENCOUNTER — Encounter (HOSPITAL_BASED_OUTPATIENT_CLINIC_OR_DEPARTMENT_OTHER): Payer: Self-pay | Admitting: Emergency Medicine

## 2014-04-28 DIAGNOSIS — R42 Dizziness and giddiness: Secondary | ICD-10-CM

## 2014-04-28 DIAGNOSIS — Z8669 Personal history of other diseases of the nervous system and sense organs: Secondary | ICD-10-CM | POA: Insufficient documentation

## 2014-04-28 DIAGNOSIS — Z8659 Personal history of other mental and behavioral disorders: Secondary | ICD-10-CM | POA: Insufficient documentation

## 2014-04-28 DIAGNOSIS — T50905A Adverse effect of unspecified drugs, medicaments and biological substances, initial encounter: Secondary | ICD-10-CM

## 2014-04-28 DIAGNOSIS — T368X5A Adverse effect of other systemic antibiotics, initial encounter: Secondary | ICD-10-CM | POA: Insufficient documentation

## 2014-04-28 DIAGNOSIS — J45909 Unspecified asthma, uncomplicated: Secondary | ICD-10-CM | POA: Insufficient documentation

## 2014-04-28 DIAGNOSIS — Z862 Personal history of diseases of the blood and blood-forming organs and certain disorders involving the immune mechanism: Secondary | ICD-10-CM | POA: Diagnosis not present

## 2014-04-28 LAB — CBC WITH DIFFERENTIAL/PLATELET
BASOS ABS: 0 10*3/uL (ref 0.0–0.1)
Basophils Relative: 0 % (ref 0–1)
EOS ABS: 0.1 10*3/uL (ref 0.0–0.7)
EOS PCT: 1 % (ref 0–5)
HCT: 35.4 % — ABNORMAL LOW (ref 36.0–46.0)
Hemoglobin: 11.4 g/dL — ABNORMAL LOW (ref 12.0–15.0)
LYMPHS ABS: 1.8 10*3/uL (ref 0.7–4.0)
Lymphocytes Relative: 25 % (ref 12–46)
MCH: 28.1 pg (ref 26.0–34.0)
MCHC: 32.2 g/dL (ref 30.0–36.0)
MCV: 87.4 fL (ref 78.0–100.0)
Monocytes Absolute: 0.7 10*3/uL (ref 0.1–1.0)
Monocytes Relative: 9 % (ref 3–12)
Neutro Abs: 4.6 10*3/uL (ref 1.7–7.7)
Neutrophils Relative %: 65 % (ref 43–77)
PLATELETS: 253 10*3/uL (ref 150–400)
RBC: 4.05 MIL/uL (ref 3.87–5.11)
RDW: 14.5 % (ref 11.5–15.5)
WBC: 7.1 10*3/uL (ref 4.0–10.5)

## 2014-04-28 LAB — BASIC METABOLIC PANEL
Anion gap: 12 (ref 5–15)
BUN: 14 mg/dL (ref 6–23)
CALCIUM: 9.9 mg/dL (ref 8.4–10.5)
CO2: 27 mEq/L (ref 19–32)
Chloride: 101 mEq/L (ref 96–112)
Creatinine, Ser: 0.8 mg/dL (ref 0.50–1.10)
GFR calc Af Amer: 85 mL/min — ABNORMAL LOW (ref >90)
GFR, EST NON AFRICAN AMERICAN: 74 mL/min — AB (ref >90)
GLUCOSE: 113 mg/dL — AB (ref 70–99)
Potassium: 4 mEq/L (ref 3.7–5.3)
Sodium: 140 mEq/L (ref 137–147)

## 2014-04-28 LAB — TROPONIN I: Troponin I: <0.3 ng/mL (ref <0.30)

## 2014-04-28 MED ORDER — SODIUM CHLORIDE 0.9 % IV BOLUS (SEPSIS)
1000.0000 mL | Freq: Once | INTRAVENOUS | Status: AC
  Administered 2014-04-28: 1000 mL via INTRAVENOUS

## 2014-04-28 MED ORDER — ONDANSETRON HCL 4 MG/2ML IJ SOLN
4.0000 mg | Freq: Once | INTRAMUSCULAR | Status: DC
  Filled 2014-04-28: qty 2

## 2014-04-28 MED ORDER — SULFAMETHOXAZOLE-TRIMETHOPRIM 800-160 MG PO TABS: 1.0000 | ORAL_TABLET | Freq: Two times a day (BID) | ORAL | Status: AC

## 2014-04-28 NOTE — ED Notes (Addendum)
Pt states she started on keflex at here yesterday was given 2 pills and they watched her to make sure she was not having a reaction since she is allergic to clindamycin,  however she got medication filled and generic name was written on bottle instead of brand name keflex, pt thought she was getting a different antibiotic today and after 1st dose she developed dizziness, which is constant, denies cp or sob

## 2014-04-28 NOTE — ED Notes (Signed)
Pt c/o dizziness and nausea after taking keflex today  Onset approx 4 hours after taking meds,  Denies pain ambulatory without diff

## 2014-04-28 NOTE — ED Provider Notes (Signed)
CSN: [id number]     Arrival date & time 04/28/14  1856 History  This chart was scribed for Candyce ChurnJohn David Erian Lariviere III, * by Nicholos Johnsenise Iheanachor, ED scribe. This patient was seen in room MH09/MH09 and the patient's care was started at 7:17 PM.    Chief Complaint  Patient presents with  . Dizziness   Patient is a 68 y.o. female presenting with dizziness. The history is provided by the patient. No language interpreter was used.  Dizziness Quality:  Lightheadedness and imbalance Severity:  Moderate Onset quality:  Gradual Timing:  Constant Progression:  Worsening Chronicity:  New Context: medication (keflex)   Context: not with loss of consciousness   Relieved by:  Nothing Worsened by:  Nothing tried Ineffective treatments:  None tried Associated symptoms: headaches and nausea   Associated symptoms: no shortness of breath, no syncope and no vomiting   Risk factors: new medications    HPI Comments: [name] [name] is a 68 y.o. female who presents to the Emergency Department complaining of dizziness; onset 4 hours after taking Keflex medication today. Dizziness is moderate. Describes as lightheaded; concern for syncope. Associated HA and nausea. Off balance with standing. Denies room spinning. Currently reports a tight feeling in her throat and some trouble swallowing since being in the ED. Seen here yesterday for returning UTI following ending of Clindamycin prescription due to allergic reaction. States she was hospitalized for allergic reaction to medication 1 month ago and stopped taking. Infection returned after. Treated with Keflex this time and started on yesterday. Given 2 pills yesterday, observed for 30 minutes and discharged following no noted reaction to medication. States she filled the prescription today and upon taking gradually began feeling dizzy and nauseous. Denies vomiting, flank pain, dysuria, fever, diaphoresis, chills, rash, hives, SOB, or abdominal pain.   Past Medical History   Diagnosis Date  . Anemia   . Anxiety   . Panic attacks   . Ringing in ear   . Reactive airway disease   . Anemia 12/15/2013    By history, per pt.   History reviewed. No pertinent past surgical history. History reviewed. No pertinent family history. History  Substance Use Topics  . Smoking status: Never Smoker   . Smokeless tobacco: Never Used  . Alcohol Use: No   OB History   Grav Para Term Preterm Abortions TAB SAB Ect Mult Living   3 3 3       3      Review of Systems  Constitutional: Negative for fever, chills and diaphoresis.  HENT: Positive for trouble swallowing.   Respiratory: Negative for shortness of breath.   Cardiovascular: Negative for syncope.  Gastrointestinal: Positive for nausea. Negative for vomiting and abdominal pain.  Genitourinary: Negative for dysuria and flank pain.  Skin: Negative for rash.  Neurological: Positive for dizziness, light-headedness and headaches. Negative for syncope.  All other systems reviewed and are negative.   Allergies  Clindamycin/lincomycin  Home Medications   Prior to Admission medications   Medication Sig Start Date End Date Taking? Authorizing Provider  cephALEXin (KEFLEX) 500 MG capsule Take 1 capsule (500 mg total) by mouth 2 (two) times daily. 04/27/14   Shon Batonourtney F Horton, MD   Triage vitals: BP 150/71  Pulse 80  Temp(Src) 98.1 F (36.7 C) (Oral)  Resp 18  Ht 5\' 7"  (1.702 m)  Wt 165 lb (74.844 kg)  BMI 25.84 kg/m2  SpO2 100%  Physical Exam  Nursing note and vitals reviewed. Constitutional: She is oriented to  person, place, and time. She appears well-developed and well-nourished. No distress.  HENT:  Head: Normocephalic and atraumatic.  Mouth/Throat: Oropharynx is clear and moist.  Eyes: Conjunctivae are normal. Pupils are equal, round, and reactive to light. No scleral icterus.  Neck: Neck supple.  Cardiovascular: Normal rate, regular rhythm, normal heart sounds and intact distal pulses.    Pulmonary/Chest: Effort normal and breath sounds normal. No stridor. No respiratory distress. She has no wheezes. She has no rales.  Abdominal: Soft. Normal appearance. She exhibits no distension. There is no tenderness.  Neurological: She is alert and oriented to person, place, and time.  Skin: Skin is warm and dry. No rash noted.  Psychiatric: She has a normal mood and affect. Her behavior is normal.    ED Course  Procedures (including critical care time) DIAGNOSTIC STUDIES: Oxygen Saturation is 100% on room air, normal by my interpretation.    COORDINATION OF CARE: At 7:27 PM: Discussed treatment plan with patient which includes IV fluids and nausea medication. Will observe for a couple of hours to see if sxs improve. Patient agrees.   Labs Review Labs Reviewed  CBC WITH DIFFERENTIAL - Abnormal; Notable for the following:    Hemoglobin 11.4 (*)    HCT 35.4 (*)    All other components within normal limits  BASIC METABOLIC PANEL - Abnormal; Notable for the following:    Glucose, Bld 113 (*)    GFR calc non Af Amer 74 (*)    GFR calc Af Amer 85 (*)    All other components within normal limits  TROPONIN I   Imaging Review No results found.   EKG Interpretation   Date/Time:  Wednesday April 28 2014 19:38:27 EDT Ventricular Rate:  72 PR Interval:  172 QRS Duration: 92 QT Interval:  368 QTC Calculation: 402 R Axis:   53 Text Interpretation:  Normal sinus rhythm Early repolarization Normal ECG  No significant change was found Confirmed by [facility] Center[facility]OFF[facility]  MD, TREY ([id number]) on  04/28/2014 7:50:50 PM      MDM   Final diagnoses:  Medication adverse effect, initial encounter  Dizziness   Pt presenting with CC of lightheadedness and nausea in setting of new medication (keflex) for UTI.  Well appearing, nontoxic, not distressed.  I don't think clinical picture represents anaphylaxis.  Symptoms resolved after IV fluids and zofran.  Labs and EKG unremarkable.  Plan to change keflex to  bactrim (she has tolerated this medication well within past year).  She appears stable for discharge and outpatient management.      I personally performed the services described in this documentation, which was scribed in my presence. The recorded information has been reviewed and is accurate.     Candyce ChurnJohn David Leonardo Makris III, MD 04/28/14 2147

## 2014-04-29 LAB — URINE CULTURE

## 2014-08-23 ENCOUNTER — Encounter (HOSPITAL_BASED_OUTPATIENT_CLINIC_OR_DEPARTMENT_OTHER): Payer: Self-pay | Admitting: Emergency Medicine

## 2019-03-23 DEATH — deceased
